# Patient Record
Sex: Female | Born: 1935 | Race: White | Hispanic: No | Marital: Married | Smoking: Never smoker
Health system: Southern US, Community
[De-identification: ages and names within clinical notes are randomized; demographics above are authoritative.]

## PROBLEM LIST (undated history)

## (undated) DIAGNOSIS — H409 Unspecified glaucoma: Secondary | ICD-10-CM

## (undated) DIAGNOSIS — K2270 Barrett's esophagus without dysplasia: Secondary | ICD-10-CM

## (undated) DIAGNOSIS — E785 Hyperlipidemia, unspecified: Secondary | ICD-10-CM

## (undated) DIAGNOSIS — K921 Melena: Secondary | ICD-10-CM

## (undated) DIAGNOSIS — R12 Heartburn: Secondary | ICD-10-CM

## (undated) DIAGNOSIS — C801 Malignant (primary) neoplasm, unspecified: Secondary | ICD-10-CM

## (undated) DIAGNOSIS — H269 Unspecified cataract: Secondary | ICD-10-CM

## (undated) DIAGNOSIS — F32A Depression, unspecified: Secondary | ICD-10-CM

## (undated) DIAGNOSIS — E871 Hypo-osmolality and hyponatremia: Secondary | ICD-10-CM

## (undated) DIAGNOSIS — D649 Anemia, unspecified: Secondary | ICD-10-CM

## (undated) DIAGNOSIS — F329 Major depressive disorder, single episode, unspecified: Secondary | ICD-10-CM

## (undated) DIAGNOSIS — R1319 Other dysphagia: Secondary | ICD-10-CM

## (undated) DIAGNOSIS — R911 Solitary pulmonary nodule: Secondary | ICD-10-CM

## (undated) DIAGNOSIS — K219 Gastro-esophageal reflux disease without esophagitis: Secondary | ICD-10-CM

## (undated) DIAGNOSIS — E119 Type 2 diabetes mellitus without complications: Secondary | ICD-10-CM

## (undated) DIAGNOSIS — K222 Esophageal obstruction: Secondary | ICD-10-CM

## (undated) DIAGNOSIS — I10 Essential (primary) hypertension: Secondary | ICD-10-CM

## (undated) HISTORY — DX: Gastro-esophageal reflux disease without esophagitis: K21.9

## (undated) HISTORY — PX: CHOLECYSTECTOMY: SHX55

## (undated) HISTORY — PX: NEPHRECTOMY: SHX65

## (undated) HISTORY — DX: Unspecified glaucoma: H40.9

## (undated) HISTORY — DX: Depression, unspecified: F32.A

## (undated) HISTORY — DX: Essential (primary) hypertension: I10

## (undated) HISTORY — DX: Unspecified cataract: H26.9

## (undated) HISTORY — DX: Hyperlipidemia, unspecified: E78.5

## (undated) HISTORY — DX: Esophageal obstruction: K22.2

## (undated) HISTORY — DX: Other dysphagia: R13.19

## (undated) HISTORY — DX: Type 2 diabetes mellitus without complications: E11.9

## (undated) HISTORY — DX: Heartburn: R12

## (undated) HISTORY — DX: Major depressive disorder, single episode, unspecified: F32.9

---

## 1982-04-19 HISTORY — PX: ABDOMINAL HYSTERECTOMY: SHX81

## 1997-10-15 ENCOUNTER — Other Ambulatory Visit: Admission: RE | Admit: 1997-10-15 | Discharge: 1997-10-15 | Payer: Self-pay | Admitting: Family Medicine

## 2005-08-11 ENCOUNTER — Emergency Department (HOSPITAL_COMMUNITY): Admission: EM | Admit: 2005-08-11 | Discharge: 2005-08-11 | Payer: Self-pay | Admitting: Emergency Medicine

## 2005-08-30 ENCOUNTER — Encounter (HOSPITAL_COMMUNITY): Admission: RE | Admit: 2005-08-30 | Discharge: 2005-09-29 | Payer: Self-pay | Admitting: Family Medicine

## 2008-04-08 ENCOUNTER — Ambulatory Visit: Payer: Self-pay | Admitting: Internal Medicine

## 2008-04-08 DIAGNOSIS — R12 Heartburn: Secondary | ICD-10-CM

## 2008-04-08 DIAGNOSIS — R1319 Other dysphagia: Secondary | ICD-10-CM

## 2008-04-08 DIAGNOSIS — R1310 Dysphagia, unspecified: Secondary | ICD-10-CM | POA: Insufficient documentation

## 2008-04-08 HISTORY — DX: Heartburn: R12

## 2008-04-08 HISTORY — DX: Other dysphagia: R13.19

## 2008-04-25 ENCOUNTER — Ambulatory Visit: Payer: Self-pay | Admitting: Internal Medicine

## 2008-04-25 ENCOUNTER — Encounter: Payer: Self-pay | Admitting: Internal Medicine

## 2008-04-29 ENCOUNTER — Encounter: Payer: Self-pay | Admitting: Internal Medicine

## 2008-07-03 ENCOUNTER — Encounter: Payer: Self-pay | Admitting: Family Medicine

## 2008-12-16 ENCOUNTER — Ambulatory Visit: Payer: Self-pay | Admitting: Family Medicine

## 2008-12-16 DIAGNOSIS — K222 Esophageal obstruction: Secondary | ICD-10-CM

## 2008-12-16 DIAGNOSIS — I10 Essential (primary) hypertension: Secondary | ICD-10-CM | POA: Insufficient documentation

## 2008-12-16 HISTORY — DX: Esophageal obstruction: K22.2

## 2008-12-16 HISTORY — DX: Essential (primary) hypertension: I10

## 2009-01-13 ENCOUNTER — Ambulatory Visit: Payer: Self-pay | Admitting: Family Medicine

## 2009-01-14 LAB — CONVERTED CEMR LAB
BUN: 10 mg/dL (ref 6–23)
CO2: 30 meq/L (ref 19–32)
Creatinine, Ser: 0.7 mg/dL (ref 0.4–1.2)
Potassium: 3.8 meq/L (ref 3.5–5.1)
Sodium: 137 meq/L (ref 135–145)

## 2009-01-16 ENCOUNTER — Ambulatory Visit: Payer: Self-pay | Admitting: Family Medicine

## 2009-01-16 DIAGNOSIS — E1165 Type 2 diabetes mellitus with hyperglycemia: Secondary | ICD-10-CM

## 2009-01-30 ENCOUNTER — Ambulatory Visit: Payer: Self-pay | Admitting: Family Medicine

## 2009-03-19 ENCOUNTER — Encounter: Payer: Self-pay | Admitting: Family Medicine

## 2009-03-24 ENCOUNTER — Encounter: Payer: Self-pay | Admitting: Internal Medicine

## 2009-05-02 ENCOUNTER — Ambulatory Visit: Payer: Self-pay | Admitting: Family Medicine

## 2009-05-06 LAB — CONVERTED CEMR LAB: Microalb, Ur: 0.5 mg/dL (ref 0.0–1.9)

## 2009-06-26 ENCOUNTER — Encounter: Payer: Self-pay | Admitting: Family Medicine

## 2009-07-01 ENCOUNTER — Encounter: Payer: Self-pay | Admitting: Family Medicine

## 2009-07-03 ENCOUNTER — Encounter: Payer: Self-pay | Admitting: Family Medicine

## 2009-07-08 ENCOUNTER — Telehealth: Payer: Self-pay | Admitting: Family Medicine

## 2009-09-01 ENCOUNTER — Ambulatory Visit: Payer: Self-pay | Admitting: Family Medicine

## 2009-09-01 DIAGNOSIS — E119 Type 2 diabetes mellitus without complications: Secondary | ICD-10-CM

## 2009-09-01 HISTORY — DX: Type 2 diabetes mellitus without complications: E11.9

## 2009-09-02 LAB — CONVERTED CEMR LAB
HDL: 55.8 mg/dL (ref >39.00)
Total CHOL/HDL Ratio: 4
VLDL: 31.2 mg/dL (ref 0.0–40.0)

## 2009-11-17 ENCOUNTER — Telehealth: Payer: Self-pay | Admitting: Family Medicine

## 2009-11-21 ENCOUNTER — Encounter: Payer: Self-pay | Admitting: Family Medicine

## 2009-12-01 ENCOUNTER — Ambulatory Visit: Payer: Self-pay | Admitting: Family Medicine

## 2009-12-01 DIAGNOSIS — E785 Hyperlipidemia, unspecified: Secondary | ICD-10-CM

## 2009-12-01 HISTORY — DX: Hyperlipidemia, unspecified: E78.5

## 2009-12-01 LAB — HM DIABETES EYE EXAM: HM Diabetic Eye Exam: NORMAL

## 2009-12-02 LAB — CONVERTED CEMR LAB
ALT: 44 units/L — ABNORMAL HIGH (ref 0–35)
AST: 35 units/L (ref 0–37)
Alkaline Phosphatase: 58 units/L (ref 39–117)
Bilirubin, Direct: 0.1 mg/dL (ref 0.0–0.3)
Cholesterol: 137 mg/dL (ref 0–200)
Hgb A1c MFr Bld: 7 % — ABNORMAL HIGH (ref 4.6–6.5)
LDL Cholesterol: 68 mg/dL (ref 0–99)
Total Bilirubin: 0.6 mg/dL (ref 0.3–1.2)
Total Protein: 6.9 g/dL (ref 6.0–8.3)

## 2010-03-03 LAB — HM DIABETES FOOT EXAM

## 2010-03-04 ENCOUNTER — Ambulatory Visit: Payer: Self-pay | Admitting: Family Medicine

## 2010-03-05 LAB — CONVERTED CEMR LAB
Creatinine,U: 77.7 mg/dL
Microalb Creat Ratio: 0.8 mg/g (ref 0.0–30.0)

## 2010-04-21 ENCOUNTER — Encounter: Payer: Self-pay | Admitting: Internal Medicine

## 2010-04-29 ENCOUNTER — Encounter: Payer: Self-pay | Admitting: Internal Medicine

## 2010-05-07 ENCOUNTER — Telehealth: Payer: Self-pay | Admitting: Internal Medicine

## 2010-05-19 ENCOUNTER — Telehealth (INDEPENDENT_AMBULATORY_CARE_PROVIDER_SITE_OTHER): Payer: Self-pay | Admitting: *Deleted

## 2010-05-19 ENCOUNTER — Encounter: Payer: Self-pay | Admitting: Internal Medicine

## 2010-05-19 NOTE — Progress Notes (Signed)
Summary: Pt called re: Metformin refill  Phone Note Call from Patient Call back at Home Phone [phone]   Caller: Patient Summary of Call: Pt called re: Metformin. Pts insurance will not cover pt taking 3 pills a day. Pt says that she was taking 2 and it was increase to 3 a day. Pls advise.    Initial call taken by: Lucy Antigua,  November 17, 2009 1:56 PM  Follow-up for Phone Call        Stu from [facility] called to say they just need a new RX for 3 tabs daily and insurance will pay.  New Rx sent Follow-up by: Sid Falcon LPN,  November 17, 2009 4:17 PM

## 2010-05-19 NOTE — Letter (Signed)
Summary: [facility]   Imported By: Maryln Gottron 04/21/2009 15:02:57  _____________________________________________________________________  External Attachment:    Type:   Image     Comment:   External Document

## 2010-05-19 NOTE — Medication Information (Signed)
Summary: Order for Diabetic Supplies  Order for Diabetic Supplies   Imported By: Maryln Gottron 06/30/2009 10:38:11  _____________________________________________________________________  External Attachment:    Type:   Image     Comment:   External Document

## 2010-05-19 NOTE — Assessment & Plan Note (Signed)
Summary: 3 mo ROV/cb   Vital Signs:  Patient profile:   77 year old female Weight:      174 pounds Temp:     98.3 degrees F oral BP sitting:   122 / 22  (left arm) Cuff size:   regular  Vitals Entered By: Sid Falcon LPN (March 04, 2010 10:02 AM)  History of Present Illness: Patient seen for followup medical problems. Type 2 diabetes well controlled. Fasting blood sugars 100-120. No postprandial blood sugars. No symptoms of hyper or hypoglycemia. Compliant with diet.  Hypertension treated with amlodipine and bisoprolol hydrochlorothiazide. previous urine microalbumin negative. Regular eye exams secondary to glaucoma.  Diabetes Management History:      She has not been enrolled in the "Diabetic Education Program".  She states understanding of dietary principles and is following her diet appropriately.  No sensory loss is reported.  Self foot exams are being performed.  She is checking home blood sugars.  She says that she is exercising.        Hypoglycemic symptoms are not occurring.  No hyperglycemic symptoms are reported.    Hypertension History:      She denies headache, chest pain, palpitations, dyspnea with exertion, orthopnea, PND, peripheral edema, visual symptoms, neurologic problems, syncope, and side effects from treatment.  She notes no problems with any antihypertensive medication side effects.        Positive major cardiovascular risk factors include female age 48 years old or older, diabetes, hyperlipidemia, and hypertension.  Negative major cardiovascular risk factors include non-tobacco-user status.     Allergies (verified): No Known Drug Allergies  Past History:  Past Surgical History: Last updated: 04/08/2008 Cholecystectomy Hysterectomy  Family History: Last updated: 12/16/2008 brain cancer sister Family History of Breast Cancer:sister No FH of Colon Cancer: Mother CVA  Social History: Last updated: 04/08/2008 Patient has never smoked.  Alcohol  Use - no Illicit Drug Use - no Patient gets regular exercise.  Risk Factors: Exercise: yes (04/08/2008)  Risk Factors: Smoking Status: never (04/08/2008)  Past Medical History: Hypertension GLAUCOMA TYPE 2 DM 9/10 Hyperlipidemia PMH-FH-SH reviewed for relevance  Review of Systems  The patient denies anorexia, fever, weight loss, chest pain, syncope, dyspnea on exertion, peripheral edema, prolonged cough, headaches, hemoptysis, abdominal pain, melena, and hematochezia.    Physical Exam  General:  Well-developed,well-nourished,in no acute distress; alert,appropriate and cooperative throughout examination Head:  Normocephalic and atraumatic without obvious abnormalities. No apparent alopecia or balding. Ears:  External ear exam shows no significant lesions or deformities.  Otoscopic examination reveals clear canals, tympanic membranes are intact bilaterally without bulging, retraction, inflammation or discharge. Hearing is grossly normal bilaterally. Mouth:  Oral mucosa and oropharynx without lesions or exudates.  Teeth in good repair. Neck:  No deformities, masses, or tenderness noted. Lungs:  Normal respiratory effort, chest expands symmetrically. Lungs are clear to auscultation, no crackles or wheezes. Heart:  normal rate and regular rhythm.   Extremities:  No clubbing, cyanosis, edema, or deformity noted with normal full range of motion of all joints.    Diabetes Management Exam:    Foot Exam (with socks and/or shoes not present):       Sensory-Pinprick/Light touch:          Left medial foot (L-4): normal          Left dorsal foot (L-5): normal          Left lateral foot (S-1): normal          Right medial foot (  L-4): normal          Right dorsal foot (L-5): normal          Right lateral foot (S-1): normal       Sensory-Monofilament:          Left foot: normal          Right foot: normal       Inspection:          Left foot: normal          Right foot: normal        Nails:          Left foot: too long          Right foot: too long    Eye Exam:       Eye Exam done elsewhere          Date: 11/19/2009          Results: normal          Done by: [facility]   Impression & Recommendations:  Problem # 1:  DIABETES MELLITUS, CONTROLLED (ICD-250.00)  Her updated medication list for this problem includes:    Metformin Hcl 500 Mg Tabs (Metformin hcl) .Marland Kitchen..Marland Kitchen Two tabs in am, one tab in pm    Glimepiride 2 Mg Tabs (Glimepiride) ..... One by mouth once daily  Orders: Venipuncture (16109) Specimen Handling (60454) TLB-A1C / Hgb A1C (Glycohemoglobin) (83036-A1C) TLB-Microalbumin/Creat Ratio, Urine (82043-MALB)  Problem # 2:  ESSENTIAL HYPERTENSION (ICD-401.9)  Her updated medication list for this problem includes:    Bisoprolol-hydrochlorothiazide 5-6.25 Mg Tabs (Bisoprolol-hydrochlorothiazide) ..... One by mouth once daily    Amlodipine Besylate 5 Mg Tabs (Amlodipine besylate) ..... One by mouth once daily  Problem # 3:  HYPERLIPIDEMIA (ICD-272.4)  Her updated medication list for this problem includes:    Simvastatin 20 Mg Tabs (Simvastatin) ..... Once daily  Complete Medication List: 1)  Alphagan P 0.15 % Soln (Brimonidine tartrate) .... 2 drops two times a day 2)  Dorzolamide Hcl 2 % Soln (Dorzolamide hcl) .... 2 drops two times a day 3)  Nexium 40 Mg Cpdr (Esomeprazole magnesium) .... Take 1 capsule by mouth 30 minutes before breakfast. 4)  Xalatan 0.005 % Soln (Latanoprost) 5)  Bisoprolol-hydrochlorothiazide 5-6.25 Mg Tabs (Bisoprolol-hydrochlorothiazide) .... One by mouth once daily 6)  Amlodipine Besylate 5 Mg Tabs (Amlodipine besylate) .... One by mouth once daily 7)  Metformin Hcl 500 Mg Tabs (Metformin hcl) .... Two tabs in am, one tab in pm 8)  Glimepiride 2 Mg Tabs (Glimepiride) .... One by mouth once daily 9)  Freestyle Lancets Misc (Lancets) .... Daily bs check 10)  Freestyle Lite Test Strp (Glucose blood) .... One to two times daily as  needed 11)  Simvastatin 20 Mg Tabs (Simvastatin) .... Once daily  Diabetes Management Assessment/Plan:      Her blood pressure goal is < 130/80.    Hypertension Assessment/Plan:      The patient's hypertensive risk group is category C: Target organ damage and/or diabetes.  Her calculated 10 year risk of coronary heart disease is 15 %.  Today's blood pressure is 122/22.  Her blood pressure goal is < 130/80.  Patient Instructions: 1)  Please schedule a follow-up appointment in 6 months .  2)  Check your blood sugars regularly. If your readings are usually above:  or below 70 you should contact our office.  3)  It is important that your diabetic A1c level is checked every 3 months.  4)  See your eye doctor yearly to check for diabetic eye damage. 5)  Check your feet each night  for sore areas, calluses or signs of infection.    Orders Added: 1)  Venipuncture [09811] 2)  Specimen Handling [99000] 3)  TLB-A1C / Hgb A1C (Glycohemoglobin) [83036-A1C] 4)  TLB-Microalbumin/Creat Ratio, Urine [82043-MALB] 5)  Est. Patient Level IV [91478]

## 2010-05-19 NOTE — Assessment & Plan Note (Signed)
Summary: ROV//SLM pt rsc/njr   Vital Signs:  Patient profile:   77 year old female Weight:      176 pounds Temp:     97.6 degrees F oral BP sitting:   130 / 80  (left arm) Cuff size:   regular  Vitals Entered By: Sid Falcon LPN (Sep 01, 2009 10:50 AM) CC: 4 month follow-up Is Patient Diabetic? Yes Did you bring your meter with you today? No   History of Present Illness: Followup type 2 diabetes. Fasting blood sugars ranging around 130-135. No hypoglycemic symptoms. Poor dietary compliance at times. Weight has remained unchanged. Due for repeat eye exam a couple of months. This has been scheduled.  Allergies (verified): No Known Drug Allergies  Past History:  Past Medical History: Last updated: 01/16/2009 Hypertension GLAUCOMA TYPE 2 DM 9/10  Past Surgical History: Last updated: 04/08/2008 Cholecystectomy Hysterectomy  Social History: Last updated: 04/08/2008 Patient has never smoked.  Alcohol Use - no Illicit Drug Use - no Patient gets regular exercise.  Review of Systems  The patient denies chest pain, syncope, dyspnea on exertion, peripheral edema, headaches, and abdominal pain.    Physical Exam  General:  Well-developed,well-nourished,in no acute distress; alert,appropriate and cooperative throughout examination Mouth:  Oral mucosa and oropharynx without lesions or exudates.  Teeth in good repair. Neck:  No deformities, masses, or tenderness noted. Lungs:  Normal respiratory effort, chest expands symmetrically. Lungs are clear to auscultation, no crackles or wheezes. Heart:  Normal rate and regular rhythm. S1 and S2 normal without gallop, murmur, click, rub or other extra sounds. Extremities:  No clubbing, cyanosis, edema, or deformity noted with normal full range of motion of all joints.     Impression & Recommendations:  Problem # 1:  DIABETES MELLITUS, CONTROLLED (ICD-250.00)  recheck A1c and patient also needs reassessment of lipids Her  updated medication list for this problem includes:    Metformin Hcl 500 Mg Tabs (Metformin hcl) ..... One by mouth two times a day    Glimepiride 2 Mg Tabs (Glimepiride) ..... One by mouth once daily  Orders: Venipuncture (16109) TLB-Lipid Panel (80061-LIPID) TLB-A1C / Hgb A1C (Glycohemoglobin) (83036-A1C)  Complete Medication List: 1)  Alphagan P 0.15 % Soln (Brimonidine tartrate) .... 2 drops two times a day 2)  Dorzolamide Hcl 2 % Soln (Dorzolamide hcl) .... 2 drops two times a day 3)  Nexium 40 Mg Cpdr (Esomeprazole magnesium) .... Take 1 capsule by mouth 30 minutes before breakfast. 4)  Xalatan 0.005 % Soln (Latanoprost) 5)  Bisoprolol-hydrochlorothiazide 5-6.25 Mg Tabs (Bisoprolol-hydrochlorothiazide) .... One by mouth once daily 6)  Amlodipine Besylate 5 Mg Tabs (Amlodipine besylate) .... One by mouth once daily 7)  Metformin Hcl 500 Mg Tabs (Metformin hcl) .... One by mouth two times a day 8)  Glimepiride 2 Mg Tabs (Glimepiride) .... One by mouth once daily 9)  Freestyle Lancets Misc (Lancets) .... Daily bs check 10)  Freestyle Lite Test Strp (Glucose blood) .... One to two times daily as needed  Patient Instructions: 1)  Please schedule a follow-up appointment in 3 months .  2)  Check your blood sugars regularly. If your readings are usually above: 140 or below 70 you should contact our office.  3)  It is important that your diabetic A1c level is checked every 3 months.  4)  See your eye doctor yearly to check for diabetic eye damage. 5)  Check your feet each night  for sore areas, calluses or signs of infection.

## 2010-05-19 NOTE — Progress Notes (Signed)
Summary: Diabetic, pt requesting Rx for sopport hose  Phone Note Call from Patient   Caller: Kathleen Schroeder [phone] Call For: Kathleen Peat MD Summary of Call: Son left VM requesting call back to discuss order for support hose for his mother as she is experiencing leg pain. LMTCB Initial call taken by: Sid Falcon LPN,  July 08, 2009 10:37 AM  Follow-up for Phone Call        Son left a message to call his mom personally.  Pt had OV scheduled for 3/28, cancelled and rescheduled for May 16.  Pt states at last OV he had recommended support hose, she just was not interrested until recently Pt reports some bil leg discomfort,lower leg cramping in the AM,  and wondered if support hose would be a good idea for her.  If yes, please send order to [facility] as they would like Medicare to help pay for stockings Follow-up by: Sid Falcon LPN,  July 09, 2009 12:05 PM  Additional Follow-up for Phone Call Additional follow up Details #1::        written. Additional Follow-up by: Kathleen Peat MD,  July 09, 2009 1:10 PM    Additional Follow-up for Phone Call Additional follow up Details #2::    Rx faxed, confirmation received, pt informed Follow-up by: Sid Falcon LPN,  July 09, 2009 1:54 PM

## 2010-05-19 NOTE — Assessment & Plan Note (Signed)
Summary: 3 MNTH ROV//SLM   Vital Signs:  Patient profile:   77 year old female Weight:      175 pounds Temp:     98.5 degrees F oral BP sitting:   120 / 70  (left arm) Cuff size:   regular  Vitals Entered By: Sid Falcon LPN (May 02, 2009 10:43 AM) CC: 3 month follow-up, Hypertension Management Is Patient Diabetic? Yes Did you bring your meter with you today? No   History of Present Illness: Patient here for followup diabetes and hypertension.  diabetes just diagnosed last fall.  she has done extremely well and fasting blood sugars have been mostly low 100s. No hypoglycemia.  Hyperglycemic sxs have resolved.  Eye checkups every 3 months. Has some visual difficulties which she relates to glaucoma.  Blood pressures been well controlled. Compliant with all medications. Denies side effects from medication. Occasional mild edema lower legs is very minimal.  Diabetes Management History:      She has not been enrolled in the "Diabetic Education Program".  She states understanding of dietary principles and is following her diet appropriately.  No sensory loss is reported.  Self foot exams are being performed.  She is checking home blood sugars.  She says that she is exercising.        Hypoglycemic symptoms are not occurring.  No hyperglycemic symptoms are reported.        There are no symptoms to suggest diabetic complications.  No changes have been made to her treatment plan since last visit.    Hypertension History:      She denies headache, chest pain, palpitations, dyspnea with exertion, orthopnea, PND, neurologic problems, syncope, and side effects from treatment.  She notes no problems with any antihypertensive medication side effects.        Positive major cardiovascular risk factors include female age 69 years old or older, diabetes, and hypertension.  Negative major cardiovascular risk factors include non-tobacco-user status.     Allergies (verified): No Known Drug  Allergies  Past History:  Past Medical History: Last updated: 01/16/2009 Hypertension GLAUCOMA TYPE 2 DM 9/10  Past Surgical History: Last updated: 04/08/2008 Cholecystectomy Hysterectomy  Social History: Last updated: 04/08/2008 Patient has never smoked.  Alcohol Use - no Illicit Drug Use - no Patient gets regular exercise.  Review of Systems      See HPI  Physical Exam  General:  Well-developed,well-nourished,in no acute distress; alert,appropriate and cooperative throughout examination Ears:  External ear exam shows no significant lesions or deformities.  Otoscopic examination reveals clear canals, tympanic membranes are intact bilaterally without bulging, retraction, inflammation or discharge. Hearing is grossly normal bilaterally. Mouth:  Oral mucosa and oropharynx without lesions or exudates.  Teeth in good repair. Neck:  No deformities, masses, or tenderness noted. Lungs:  Normal respiratory effort, chest expands symmetrically. Lungs are clear to auscultation, no crackles or wheezes. Heart:  normal rate, regular rhythm, and no gallop.   Extremities:  No clubbing, cyanosis, edema, or deformity noted with normal full range of motion of all joints.  no foot lesions noted  Diabetes Management Exam:    Foot Exam (with socks and/or shoes not present):       Sensory-Pinprick/Light touch:          Left medial foot (L-4): normal          Left dorsal foot (L-5): normal          Left lateral foot (S-1): normal  Right medial foot (L-4): normal          Right dorsal foot (L-5): normal          Right lateral foot (S-1): normal       Sensory-Monofilament:          Left foot: normal          Right foot: normal       Inspection:          Left foot: normal          Right foot: normal       Nails:          Left foot: normal          Right foot: normal   Impression & Recommendations:  Problem # 1:  DIABETES MELLITUS, TYPE II, UNCONTROLLED (ICD-250.02) Assessment  Improved recheck A1C.  urine microalb. Her updated medication list for this problem includes:    Metformin Hcl 500 Mg Tabs (Metformin hcl) ..... One by mouth two times a day    Glimepiride 2 Mg Tabs (Glimepiride) ..... One by mouth once daily  Orders: TLB-A1C / Hgb A1C (Glycohemoglobin) (83036-A1C) TLB-Microalbumin/Creat Ratio, Urine (82043-MALB) Venipuncture (27253)  Problem # 2:  ESSENTIAL HYPERTENSION (ICD-401.9) Assessment: Unchanged  Her updated medication list for this problem includes:    Bisoprolol-hydrochlorothiazide 5-6.25 Mg Tabs (Bisoprolol-hydrochlorothiazide) ..... One by mouth once daily    Amlodipine Besylate 5 Mg Tabs (Amlodipine besylate) ..... One by mouth once daily  Complete Medication List: 1)  Alphagan P 0.15 % Soln (Brimonidine tartrate) .... 2 drops two times a day 2)  Dorzolamide Hcl 2 % Soln (Dorzolamide hcl) .... 2 drops two times a day 3)  Nexium 40 Mg Cpdr (Esomeprazole magnesium) .... Take 1 capsule by mouth 30 minutes before breakfast. 4)  Xalatan 0.005 % Soln (Latanoprost) 5)  Bisoprolol-hydrochlorothiazide 5-6.25 Mg Tabs (Bisoprolol-hydrochlorothiazide) .... One by mouth once daily 6)  Amlodipine Besylate 5 Mg Tabs (Amlodipine besylate) .... One by mouth once daily 7)  Metformin Hcl 500 Mg Tabs (Metformin hcl) .... One by mouth two times a day 8)  Glimepiride 2 Mg Tabs (Glimepiride) .... One by mouth once daily 9)  Fluconazole 150 Mg Tabs (Fluconazole) .... One by mouth times one dose 10)  Freestyle Lancets Misc (Lancets) .... Daily bs check 11)  Freestyle Lite Test Strp (Glucose blood) .... One to two times daily as needed  Hypertension Assessment/Plan:      The patient's hypertensive risk group is category C: Target organ damage and/or diabetes.  Today's blood pressure is 120/70.    Patient Instructions: 1)  Please schedule a follow-up appointment in 4 months .  2)  It is important that you exercise reguarly at least 20 minutes 5 times a week.  If you develop chest pain, have severe difficulty breathing, or feel very tired, stop exercising immediately and seek medical attention.  3)  Check your blood sugars regularly. If your readings are usually above:  or below 70 you should contact our office.  4)  See your eye doctor yearly to check for diabetic eye damage. 5)  Check your feet each night  for sore areas, calluses or signs of infection.

## 2010-05-19 NOTE — Assessment & Plan Note (Signed)
Summary: 3 month rov/njr   Vital Signs:  Patient profile:   77 year old female Weight:      174 pounds Temp:     97.8 degrees F oral BP sitting:   140 / 80  (left arm) Cuff size:   regular  Vitals Entered By: Sid Falcon LPN (December 01, 2009 10:24 AM) CC: 3 month follow-up, Hypertension Management Is Patient Diabetic? Yes Did you bring your meter with you today? Yes  Vision Screening:      Vision Comments: 07/2010   History of Present Illness: Follow for multiple medical problems as below  Hypertension treated with bisoprolol- hydrochlorothiazide and amlodipine. Patient compliant with medications. No orthostatic symptoms.  Type 2 diabetes. Recent A1c 7.5%. Titrated metformin. Blood sugars reviewed and she is seeing improvement past couple of months. No hypoglycemic symptoms.  Hyperlipidemia. LDL not at goal for diabetic. Initiated simvastatin 20 mg daily. No myalgias or other side effect. Needs followup labs.  Diabetes Management History:      She has not been enrolled in the "Diabetic Education Program".  She states understanding of dietary principles and is following her diet appropriately.  No sensory loss is reported.  Self foot exams are being performed.  She is checking home blood sugars.  She says that she is exercising.        Hypoglycemic symptoms are not occurring.  No hyperglycemic symptoms are reported.        The following changes have been made to her treatment plan since last visit: medication changes.  Treatment plan changes were initiated by MD.    Hypertension History:      She denies headache, chest pain, palpitations, dyspnea with exertion, orthopnea, PND, peripheral edema, neurologic problems, syncope, and side effects from treatment.        Positive major cardiovascular risk factors include female age 63 years old or older, diabetes, hyperlipidemia, and hypertension.  Negative major cardiovascular risk factors include non-tobacco-user status.      Allergies (verified): No Known Drug Allergies  Past History:  Past Medical History: Last updated: 01/16/2009 Hypertension GLAUCOMA TYPE 2 DM 9/10  Past Surgical History: Last updated: 04/08/2008 Cholecystectomy Hysterectomy  Family History: Last updated: 12/16/2008 brain cancer sister Family History of Breast Cancer:sister No FH of Colon Cancer: Mother CVA  Social History: Last updated: 04/08/2008 Patient has never smoked.  Alcohol Use - no Illicit Drug Use - no Patient gets regular exercise.  Risk Factors: Exercise: yes (04/08/2008)  Risk Factors: Smoking Status: never (04/08/2008)  Review of Systems  The patient denies anorexia, fever, weight loss, weight gain, chest pain, syncope, dyspnea on exertion, peripheral edema, prolonged cough, headaches, hemoptysis, abdominal pain, melena, hematochezia, and severe indigestion/heartburn.    Physical Exam  General:  Well-developed,well-nourished,in no acute distress; alert,appropriate and cooperative throughout examination Mouth:  Oral mucosa and oropharynx without lesions or exudates.  Teeth in good repair. Neck:  No deformities, masses, or tenderness noted. Lungs:  Normal respiratory effort, chest expands symmetrically. Lungs are clear to auscultation, no crackles or wheezes. Heart:  normal rate and regular rhythm.   Extremities:  no edema  Diabetes Management Exam:    Foot Exam (with socks and/or shoes not present):       Sensory-Pinprick/Light touch:          Left medial foot (L-4): normal          Left dorsal foot (L-5): normal          Left lateral foot (S-1): normal  Right medial foot (L-4): normal          Right dorsal foot (L-5): normal          Right lateral foot (S-1): normal       Sensory-Monofilament:          Left foot: normal          Right foot: normal       Inspection:          Left foot: normal          Right foot: normal       Nails:          Left foot: normal          Right  foot: normal    Eye Exam:       Eye Exam done elsewhere          Date: 07/23/2009          Results: normal          Done by: Dr Gwen Pounds   Impression & Recommendations:  Problem # 1:  DIABETES MELLITUS, TYPE II, UNCONTROLLED (ICD-250.02) repeat A1C Her updated medication list for this problem includes:    Metformin Hcl 500 Mg Tabs (Metformin hcl) .Marland Kitchen..Marland Kitchen Two tabs in am, one tab in pm    Glimepiride 2 Mg Tabs (Glimepiride) ..... One by mouth once daily  Orders: TLB-A1C / Hgb A1C (Glycohemoglobin) (83036-A1C)  Problem # 2:  ESSENTIAL HYPERTENSION (ICD-401.9)  Her updated medication list for this problem includes:    Bisoprolol-hydrochlorothiazide 5-6.25 Mg Tabs (Bisoprolol-hydrochlorothiazide) ..... One by mouth once daily    Amlodipine Besylate 5 Mg Tabs (Amlodipine besylate) ..... One by mouth once daily  Problem # 3:  HYPERLIPIDEMIA (ICD-272.4) repeat lipids. Her updated medication list for this problem includes:    Simvastatin 20 Mg Tabs (Simvastatin) ..... Once daily  Orders: Venipuncture (38756) Specimen Handling (43329) TLB-Lipid Panel (80061-LIPID) TLB-Hepatic/Liver Function Pnl (80076-HEPATIC)  Complete Medication List: 1)  Alphagan P 0.15 % Soln (Brimonidine tartrate) .... 2 drops two times a day 2)  Dorzolamide Hcl 2 % Soln (Dorzolamide hcl) .... 2 drops two times a day 3)  Nexium 40 Mg Cpdr (Esomeprazole magnesium) .... Take 1 capsule by mouth 30 minutes before breakfast. 4)  Xalatan 0.005 % Soln (Latanoprost) 5)  Bisoprolol-hydrochlorothiazide 5-6.25 Mg Tabs (Bisoprolol-hydrochlorothiazide) .... One by mouth once daily 6)  Amlodipine Besylate 5 Mg Tabs (Amlodipine besylate) .... One by mouth once daily 7)  Metformin Hcl 500 Mg Tabs (Metformin hcl) .... Two tabs in am, one tab in pm 8)  Glimepiride 2 Mg Tabs (Glimepiride) .... One by mouth once daily 9)  Freestyle Lancets Misc (Lancets) .... Daily bs check 10)  Freestyle Lite Test Strp (Glucose blood) .... One to  two times daily as needed 11)  Simvastatin 20 Mg Tabs (Simvastatin) .... Once daily  Diabetes Management Assessment/Plan:      Her blood pressure goal is < 130/80.    Hypertension Assessment/Plan:      The patient's hypertensive risk group is category C: Target organ damage and/or diabetes.  Today's blood pressure is 140/80.  Her blood pressure goal is < 130/80.  Patient Instructions: 1)  Please schedule a follow-up appointment in 3 months .  2)  It is important that you exercise reguarly at least 20 minutes 5 times a week. If you develop chest pain, have severe difficulty breathing, or feel very tired, stop exercising immediately and seek medical attention.  3)  Check your  blood sugars regularly. If your readings are usually above:  or below 70 you should contact our office.  4)  See your eye doctor yearly to check for diabetic eye damage. 5)  Check your feet each night  for sore areas, calluses or signs of infection.  Prescriptions: SIMVASTATIN 20 MG TABS (SIMVASTATIN) once daily  #30 x 11   Entered and Authorized by:   Evelena Peat MD   Signed by:   Evelena Peat MD on 12/01/2009   Method used:   Faxed to ...       Hospital doctor (retail)       [address]       [place], [place]  [address]       Ph: [phone] or [phone]       Fax: [phone]   RxID:   [id number] GLIMEPIRIDE 2 MG TABS (GLIMEPIRIDE) one by mouth once daily  #30 x 11   Entered and Authorized by:   Evelena Peat MD   Signed by:   Evelena Peat MD on 12/01/2009   Method used:   Faxed to ...       Hospital doctor (retail)       [address]       [place], [place]  [address]       Ph: [phone] or [phone]       Fax: [phone]   RxID:   [id number] AMLODIPINE BESYLATE 5 MG TABS (AMLODIPINE BESYLATE) one by mouth once daily  #30 x 11   Entered and Authorized by:   Evelena Peat MD   Signed by:   Evelena Peat  MD on 12/01/2009   Method used:   Faxed to ...       Hospital doctor (retail)       [address]       [place], [place]  [address]       Ph: [phone] or [phone]       Fax: [phone]   RxID:   [id number] BISOPROLOL-HYDROCHLOROTHIAZIDE 5-6.25 MG TABS (BISOPROLOL-HYDROCHLOROTHIAZIDE) one by mouth once daily  #30 x 11   Entered and Authorized by:   Evelena Peat MD   Signed by:   Evelena Peat MD on 12/01/2009   Method used:   Faxed to ...       Hospital doctor (retail)       [address]       [place], [place]  [address]       Ph: [phone] or [phone]       Fax: [phone]   RxID:   [id number]

## 2010-05-19 NOTE — Medication Information (Signed)
Summary: Order for Diabetic Supplies  Order for Diabetic Supplies   Imported By: Maryln Gottron 07/02/2009 15:48:07  _____________________________________________________________________  External Attachment:    Type:   Image     Comment:   External Document

## 2010-05-19 NOTE — Medication Information (Signed)
Summary: Order for Diabetes Testing Supplies  Order for Diabetes Testing Supplies   Imported By: Maryln Gottron 11/26/2009 12:21:35  _____________________________________________________________________  External Attachment:    Type:   Image     Comment:   External Document

## 2010-05-19 NOTE — Medication Information (Signed)
Summary: Order for Diabetic Supplies  Order for Diabetic Supplies   Imported By: Maryln Gottron 07/04/2009 13:40:05  _____________________________________________________________________  External Attachment:    Type:   Image     Comment:   External Document

## 2010-05-21 NOTE — Letter (Signed)
Summary: Endoscopy Letter  [facility]  [address] [place], [place] [address]   Phone: [phone]  Fax: [phone]      April 29, 2010 MRN: [id number]   KENDEL PESNELL [address] [place], [place]  [address]   Dear Ms. Luan Pulling,   According to your medical record, it is time for you to schedule an Endoscopy. Endoscopic screening is recommended for patients with certain upper digestive tract conditions because of associated increased risk for cancers of the upper digestive system.  This letter has been generated based on the recommendations made at the time of your prior procedure. If you feel that in your particular situation this may no longer apply, please contact our office.  Please call our office at [phone]) to schedule this appointment or to update your records at your earliest convenience.  Thank you for cooperating with [place] to provide you with the very best care possible.   Sincerely,  Hedwig Morton. Juanda Chance, M.D.  [facility] [phone]

## 2010-05-21 NOTE — Progress Notes (Signed)
Summary: fyi  Phone Note Call from Patient   Caller: Patient Call For: Dr Juanda Chance Summary of Call: Patient wants to let Dr Juanda Chance know that she does not need to have another EGD because she has not had any trouble since her last one. Initial call taken by: Tawni Levy,  May 07, 2010 1:03 PM  Follow-up for Phone Call        Called patient and explained that when she had her EGD in 04/2008, she had Barrett's esophagus. Explained to patient that this while it is not cancer, can be precancerous and needs to be watched. Told her Dr. Juanda Chance recommends she have an endoscopy. Patient states she will discuss a time to do this with her husband and call back to schedule. Follow-up by: Jesse Fall RN,  May 07, 2010 1:38 PM  Additional Follow-up for Phone Call Additional follow up Details #1::        The reason for repear EGD is not her symptoms but the precancerous tissue in her esophagus. 2 year interval is resommended by [facility] to biopsy the suspicious tissue in her esophagus. If she fails to schedule, please resend recall  letter in 6 months Additional Follow-up by: Hart Carwin MD,  May 07, 2010 5:04 PM    Additional Follow-up for Phone Call Additional follow up Details #2::    Sent a flag to [facility] to send a recall is patient has not rescheduled  by 09/2010.  Follow-up by: Jesse Fall RN,  May 08, 2010 9:09 AM

## 2010-05-21 NOTE — Procedures (Signed)
Summary: Recall Assessment  Recall Assessment   Imported By: Lester [place] 05/06/2010 10:46:01  _____________________________________________________________________  External Attachment:    Type:   Image     Comment:   External Document

## 2010-05-27 NOTE — Letter (Signed)
Summary: Pre Visit Letter Revised  [facility]  [address] [place], [place] [address]   Phone: [phone]  Fax: [phone]        05/19/2010 MRN: [id number] Kathleen Schroeder [address] [place], [place]  [address]             Procedure Date:  06/23/2010  Welcome to the Gastroenterology Division at [facility].    You are scheduled to see a nurse for your pre-procedure visit on 06/16/2010  at 1:30 PM  on the 3rd floor at [facility], [address]  We ask that you try to arrive at our office 15 minutes prior to your appointment time to allow for check-in.  Please take a minute to review the attached form.  If you answer "Yes" to one or more of the questions on the first page, we ask that you call the person listed at your earliest opportunity.  If you answer "No" to all of the questions, please complete the rest of the form and bring it to your appointment.    Your nurse visit will consist of discussing your medical and surgical history, your immediate family medical history, and your medications.   If you are unable to list all of your medications on the form, please bring the medication bottles to your appointment and we will list them.  We will need to be aware of both prescribed and over the counter drugs.  We will need to know exact dosage information as well.    Please be prepared to read and sign documents such as consent forms, a financial agreement, and acknowledgement forms.  If necessary, and with your consent, a friend or relative is welcome to sit-in on the nurse visit with you.  Please bring your insurance card so that we may make a copy of it.  If your insurance requires a referral to see a specialist, please bring your referral form from your primary care physician.  No co-pay is required for this nurse visit.     If you cannot keep your appointment, please call [phone] to cancel or reschedule prior to your appointment date.  This allows  [place] the opportunity to schedule an appointment for another patient in need of care.    Thank you for choosing [facility] for your medical needs.  We appreciate the opportunity to care for you.  Please visit [place] at our website  to learn more about our practice.  Sincerely, The Gastroenterology Division          Appended Document: Pre Visit Letter Revised Mailed out to patient on 05/19/10

## 2010-05-27 NOTE — Progress Notes (Signed)
  Phone Note Outgoing Call Call back at [facility] Phone [phone]   Call placed by: Jesse Fall, Rn Call placed to: Patient Summary of Call: Spoke with patient re: scheduling an EGD. She wishes to schedule in March to try to avoid bad weather. Scheduled patient on 06/23/10 1:30 PM for EGD with Dr. Juanda Chance at [facility]. Scheduled patient for previsit on 06/16/10 at 1:30 PM. Letter mailed to patient for previsit. Initial call taken by: Jesse Fall RN,  May 19, 2010 9:54 AM

## 2010-06-23 ENCOUNTER — Other Ambulatory Visit: Payer: Self-pay | Admitting: Internal Medicine

## 2010-07-15 ENCOUNTER — Other Ambulatory Visit: Payer: Self-pay | Admitting: Family Medicine

## 2010-08-03 ENCOUNTER — Other Ambulatory Visit: Payer: Self-pay | Admitting: Family Medicine

## 2010-09-01 ENCOUNTER — Encounter: Payer: Self-pay | Admitting: Family Medicine

## 2010-09-02 ENCOUNTER — Encounter: Payer: Self-pay | Admitting: Family Medicine

## 2010-09-02 ENCOUNTER — Ambulatory Visit (INDEPENDENT_AMBULATORY_CARE_PROVIDER_SITE_OTHER): Payer: Medicare Other | Admitting: Family Medicine

## 2010-09-02 DIAGNOSIS — E785 Hyperlipidemia, unspecified: Secondary | ICD-10-CM

## 2010-09-02 DIAGNOSIS — I10 Essential (primary) hypertension: Secondary | ICD-10-CM

## 2010-09-02 DIAGNOSIS — E119 Type 2 diabetes mellitus without complications: Secondary | ICD-10-CM

## 2010-09-02 DIAGNOSIS — L820 Inflamed seborrheic keratosis: Secondary | ICD-10-CM

## 2010-09-02 LAB — BASIC METABOLIC PANEL
Calcium: 10 mg/dL (ref 8.4–10.5)
Creatinine, Ser: 0.8 mg/dL (ref 0.4–1.2)
GFR: 77.06 mL/min (ref >60.00)

## 2010-09-02 LAB — HEPATIC FUNCTION PANEL
ALT: 73 U/L — ABNORMAL HIGH (ref 0–35)
Albumin: 3.6 g/dL (ref 3.5–5.2)
Bilirubin, Direct: 0.1 mg/dL (ref 0.0–0.3)
Total Protein: 6.8 g/dL (ref 6.0–8.3)

## 2010-09-02 LAB — HEMOGLOBIN A1C: Hgb A1c MFr Bld: 7.3 % — ABNORMAL HIGH (ref 4.6–6.5)

## 2010-09-02 LAB — LIPID PANEL
Cholesterol: 140 mg/dL (ref 0–200)
HDL: 52.4 mg/dL (ref >39.00)
LDL Cholesterol: 65 mg/dL (ref 0–99)
Total CHOL/HDL Ratio: 3
Triglycerides: 114 mg/dL (ref 0.0–149.0)
VLDL: 22.8 mg/dL (ref 0.0–40.0)

## 2010-09-02 MED ORDER — GLUCOSE BLOOD VI STRP: ORAL_STRIP | Status: DC

## 2010-09-02 NOTE — Progress Notes (Signed)
  Subjective:    Patient ID: Kathleen Schroeder, female    DOB: 1930/06/18, 77 y.o.   MRN: [id number]  HPI Here for followup multiple medical problems. She has type 2 diabetes, hyperlipidemia, hypertension, history of GERD with esophageal stricture. Medications reviewed. Compliant with all. Fasting blood sugars around 120. No symptoms of hyper or hypoglycemia. Walks some for exercise. No recent chest pain or dizziness.  No recent dysphagia.  GERD stable.  She has tried to leave off Nexium but consistent break through symptoms.  No side effects from simvastatin such as myalgias. No history of CAD or peripheral vascular disease. She gets regular eye exams 2-3 times per year from ophthalmologist.  New problem of irritated seborrheic keratosis right cheek. She will like to have treated. No history of skin cancer   Review of Systems  Constitutional: Negative for fatigue.  Eyes: Negative for visual disturbance.  Respiratory: Negative for cough, chest tightness, shortness of breath and wheezing.   Cardiovascular: Negative for chest pain, palpitations and leg swelling.  Genitourinary: Negative for dysuria.  Neurological: Negative for dizziness, seizures, syncope, weakness, light-headedness and headaches.  Psychiatric/Behavioral: Negative for dysphoric mood.       Objective:   Physical Exam  Constitutional: She is oriented to person, place, and time. She appears well-developed and well-nourished.  HENT:  Head: Normocephalic and atraumatic.  Right Ear: External ear normal.  Left Ear: External ear normal.  Mouth/Throat: Oropharynx is clear and moist. No oropharyngeal exudate.  Neck: No thyromegaly present.  Cardiovascular: Normal rate and regular rhythm.   Pulmonary/Chest: Effort normal and breath sounds normal. No respiratory distress. She has no wheezes. She has no rales.  Musculoskeletal: She exhibits no edema.  Lymphadenopathy:    She has no cervical adenopathy.  Neurological: She is alert  and oriented to person, place, and time. No cranial nerve deficit.  Skin:       Right cheek reveals brown well-demarcated scaly lesion compatible with seborrheic keratosis  Psychiatric: She has a normal mood and affect.          Assessment & Plan:  #1 type 2 diabetes. Reassess A1c. Continue yearly exam #2 hyperlipidemia. Recheck lipid and hepatic panel #3 hypertension stable continue current medications #4 irritated seborrheic keratosis right cheek discussed risk and benefits of treatment with liquid nitrogen and she consents.  Treated without difficulty

## 2010-09-03 NOTE — Progress Notes (Signed)
Quick Note:  Pt informed, labs mailed to pt home with instructions ______

## 2010-09-18 ENCOUNTER — Encounter: Payer: Self-pay | Admitting: *Deleted

## 2010-09-18 ENCOUNTER — Telehealth: Payer: Self-pay | Admitting: *Deleted

## 2010-09-18 NOTE — Telephone Encounter (Signed)
Letter mailed to patient for EGD recall

## 2010-09-18 NOTE — Telephone Encounter (Signed)
Message copied by Kathleen Schroeder on Fri Sep 18, 2010  8:59 AM ------      Message from: Kathleen Schroeder      Created: Wed Jun 24, 2010 11:32 AM       Patient should have scheduled a recall EGD for Barrett's. If she has not send recall letter 09/2010

## 2010-09-23 ENCOUNTER — Emergency Department (HOSPITAL_COMMUNITY)
Admission: EM | Admit: 2010-09-23 | Discharge: 2010-09-23 | Disposition: A | Discharge status: Home / Self Care | Payer: Medicare Other | Attending: Emergency Medicine | Admitting: Emergency Medicine

## 2010-09-23 ENCOUNTER — Emergency Department (HOSPITAL_COMMUNITY): Payer: Medicare Other

## 2010-09-23 DIAGNOSIS — Z79899 Other long term (current) drug therapy: Secondary | ICD-10-CM | POA: Insufficient documentation

## 2010-09-23 DIAGNOSIS — R0789 Other chest pain: Secondary | ICD-10-CM | POA: Insufficient documentation

## 2010-09-23 DIAGNOSIS — E119 Type 2 diabetes mellitus without complications: Secondary | ICD-10-CM | POA: Insufficient documentation

## 2010-09-23 DIAGNOSIS — M549 Dorsalgia, unspecified: Secondary | ICD-10-CM | POA: Insufficient documentation

## 2010-09-23 DIAGNOSIS — I10 Essential (primary) hypertension: Secondary | ICD-10-CM | POA: Insufficient documentation

## 2010-09-23 LAB — DIFFERENTIAL
Basophils Relative: 0 % (ref 0–1)
Lymphocytes Relative: 24 % (ref 12–46)
Neutro Abs: 4.3 10*3/uL (ref 1.7–7.7)

## 2010-09-23 LAB — BASIC METABOLIC PANEL
BUN: 15 mg/dL (ref 6–23)
CO2: 29 mEq/L (ref 19–32)
Calcium: 10.7 mg/dL — ABNORMAL HIGH (ref 8.4–10.5)
Chloride: 98 mEq/L (ref 96–112)
Creatinine, Ser: 0.71 mg/dL (ref 0.4–1.2)
Potassium: 3.9 mEq/L (ref 3.5–5.1)
Sodium: 136 mEq/L (ref 135–145)

## 2010-09-23 LAB — TROPONIN I
Troponin I: <0.3 ng/mL (ref <0.30)
Troponin I: <0.3 ng/mL (ref <0.30)

## 2010-09-23 LAB — CK TOTAL AND CKMB (NOT AT ARMC)
CK, MB: 2.5 ng/mL (ref 0.3–4.0)
CK, MB: 2.6 ng/mL (ref 0.3–4.0)
Relative Index: INVALID (ref 0.0–2.5)
Total CK: 66 U/L (ref 7–177)

## 2010-09-23 LAB — CBC
HCT: 42.2 % (ref 36.0–46.0)
MCH: 28.6 pg (ref 26.0–34.0)
MCV: 91.3 fL (ref 78.0–100.0)
Platelets: 212 10*3/uL (ref 150–400)

## 2010-09-28 ENCOUNTER — Encounter: Payer: Self-pay | Admitting: Family Medicine

## 2010-09-28 ENCOUNTER — Ambulatory Visit (INDEPENDENT_AMBULATORY_CARE_PROVIDER_SITE_OTHER): Payer: Medicare Other | Admitting: Family Medicine

## 2010-09-28 VITALS — BP 130/68 | Temp 98.1°F | Wt 172.0 lb

## 2010-09-28 DIAGNOSIS — B029 Zoster without complications: Secondary | ICD-10-CM

## 2010-09-28 MED ORDER — GABAPENTIN (ONCE-DAILY) 300 MG PO TABS: 1.0000 | ORAL_TABLET | Freq: Three times a day (TID) | ORAL | Status: DC

## 2010-09-28 MED ORDER — VALACYCLOVIR HCL 1 G PO TABS: 1000.0000 mg | ORAL_TABLET | Freq: Three times a day (TID) | ORAL | Status: DC

## 2010-09-28 NOTE — Progress Notes (Signed)
  Subjective:    Patient ID: Kathleen Schroeder, female    DOB: May 23, 1930, 77 y.o.   MRN: [id number]  HPI Patient is seen with rash right chest wall region. She had history 9 days ago of some chest and back pain. She went to emergency room and had multiple tests number unremarkable. The following day which was last Wednesday she broke out blistery rash right chest wall and back dermatome around T6. No fever or chills. Pain is severe at x10 out of 10. Sharp burning and stinging pain. She took oxycodone which provided temporary relief.   Review of Systems  Constitutional: Negative for fever, chills, appetite change and unexpected weight change.  Respiratory: Negative for shortness of breath.   Cardiovascular: Negative for chest pain.  Skin: Positive for rash.       Objective:   Physical Exam  Constitutional: She appears well-developed and well-nourished. No distress.  Cardiovascular: Normal rate and regular rhythm.   Pulmonary/Chest: Effort normal and breath sounds normal. No respiratory distress. She has no wheezes. She has no rales.  Musculoskeletal: She exhibits no edema.  Skin: Rash noted.       Patient has rash which extends from the midline of the back all the way to the front of the midline following a dermatome distribution. She has erythematous base with fascicular surface. She has scattered patches          Assessment & Plan:  Shingles. Patient is offered acute pain medication but she defers. She does agree to Valtrex 1 g 3 times a day for 7 days and also gabapentin 300 mg each bedtime and titrate every 3 days up to 3 times a day. Touch base and one to 2 weeks if no improvement. Followup for signs of secondary infection.

## 2010-09-28 NOTE — Patient Instructions (Signed)
Keep skin lesion clean with soap and water. Start gabapentin one at night for 3 nights then increase to one twice daily if needed.  If still in pain at 2 daily may increase in another 3 days to one three times daily.

## 2010-10-01 ENCOUNTER — Other Ambulatory Visit: Payer: Self-pay | Admitting: Family Medicine

## 2010-10-01 MED ORDER — OXYCODONE-ACETAMINOPHEN 5-325 MG PO TABS: 1.0000 | ORAL_TABLET | Freq: Four times a day (QID) | ORAL | Status: DC | PRN

## 2010-10-01 NOTE — Telephone Encounter (Signed)
Son informed he will need to pick-up pain med as it needs to be written/signed

## 2010-10-01 NOTE — Telephone Encounter (Signed)
Pts son called and said that med given to pt for shingles is not helping at all. Getting worse. Pt req a refill of Percocet 5-325 mg or stronger dose asap. Pls call in to [facility] asap today. Pts son is req to be notified, as to status.

## 2010-10-01 NOTE — Telephone Encounter (Signed)
Please advise 

## 2010-10-01 NOTE — Telephone Encounter (Signed)
Percocet 5/325 one po q 6 hours prn pain, disp #30 with no refills.

## 2010-10-05 ENCOUNTER — Other Ambulatory Visit: Payer: Self-pay | Admitting: *Deleted

## 2010-10-05 MED ORDER — METFORMIN HCL 500 MG PO TABS: ORAL_TABLET | ORAL | Status: DC

## 2010-10-20 ENCOUNTER — Encounter: Payer: Self-pay | Admitting: Family Medicine

## 2010-10-20 ENCOUNTER — Ambulatory Visit (INDEPENDENT_AMBULATORY_CARE_PROVIDER_SITE_OTHER): Payer: Medicare Other | Admitting: Family Medicine

## 2010-10-20 VITALS — BP 140/80 | Temp 98.0°F | Wt 170.0 lb

## 2010-10-20 DIAGNOSIS — B0229 Other postherpetic nervous system involvement: Secondary | ICD-10-CM

## 2010-10-20 MED ORDER — OXYCODONE-ACETAMINOPHEN 5-325 MG PO TABS: 1.0000 | ORAL_TABLET | Freq: Four times a day (QID) | ORAL | Status: DC | PRN

## 2010-10-20 NOTE — Progress Notes (Signed)
  Subjective:    Patient ID: Kathleen Schroeder, female    DOB: 1930/10/19, 77 y.o.   MRN: [id number]  HPI Patient seen for followup shingles. Fairly extensive involvement right trunk. She's had severe pain still at times rated 8-9/10. We've started gabapentin currently 300 mg 3 times daily also supplementing with oxycodone 5 mg. She needs refills. Generally sleeping well but has significant daytime pain. Burning and stinging pain. Sore to touch. Rash is healing well.   Review of Systems  Constitutional: Negative for fever, chills, appetite change and fatigue.  Cardiovascular: Negative for chest pain.       Objective:   Physical Exam  Constitutional: She appears well-developed and well-nourished.  Cardiovascular: Normal rate, regular rhythm and normal heart sounds.   Pulmonary/Chest: Effort normal and breath sounds normal. No respiratory distress. She has no wheezes. She has no rales.  Skin:       Rash is healing well no vesicles at this time          Assessment & Plan:  Recent shingles which is healing well but she has significant postherpetic neuralgia. Increase Neurontin to 300 mg 2 tablets in the morning, 1 at lunch, and one at night. Refill oxycodone 5 mg #60 tablets and reviewed possible side effects. Reassess in 3 weeks

## 2010-10-20 NOTE — Patient Instructions (Signed)
Increase gabapentin to 2 in morning and continue with one at lunch and one at night.

## 2010-11-14 ENCOUNTER — Other Ambulatory Visit: Payer: Self-pay | Admitting: Family Medicine

## 2010-12-11 ENCOUNTER — Other Ambulatory Visit: Payer: Self-pay | Admitting: Family Medicine

## 2011-03-04 ENCOUNTER — Encounter: Payer: Self-pay | Admitting: Family Medicine

## 2011-03-06 ENCOUNTER — Other Ambulatory Visit: Payer: Self-pay | Admitting: Family Medicine

## 2011-03-08 ENCOUNTER — Ambulatory Visit: Payer: Medicare Other | Admitting: Family Medicine

## 2011-03-10 ENCOUNTER — Ambulatory Visit (INDEPENDENT_AMBULATORY_CARE_PROVIDER_SITE_OTHER): Payer: Medicare Other | Admitting: Family Medicine

## 2011-03-10 ENCOUNTER — Encounter: Payer: Self-pay | Admitting: Family Medicine

## 2011-03-10 VITALS — BP 150/82 | HR 72 | Temp 98.0°F | Resp 12 | Ht 64.5 in | Wt 170.0 lb

## 2011-03-10 DIAGNOSIS — E785 Hyperlipidemia, unspecified: Secondary | ICD-10-CM

## 2011-03-10 DIAGNOSIS — E119 Type 2 diabetes mellitus without complications: Secondary | ICD-10-CM

## 2011-03-10 DIAGNOSIS — I10 Essential (primary) hypertension: Secondary | ICD-10-CM

## 2011-03-10 LAB — HEMOGLOBIN A1C: Hgb A1c MFr Bld: 7 % — ABNORMAL HIGH (ref 4.6–6.5)

## 2011-03-10 MED ORDER — AMLODIPINE BESY-BENAZEPRIL HCL 5-10 MG PO CAPS: 1.0000 | ORAL_CAPSULE | Freq: Every day | ORAL | Status: DC

## 2011-03-10 NOTE — Progress Notes (Signed)
  Subjective:    Patient ID: Kathleen Schroeder, female    DOB: 09-Sep-1930, 77 y.o.   MRN: [id number]  HPI Medical followup. Patient has history of type 2 diabetes, hyperlipidemia, hypertension, and GERD. Medications are reviewed. Compliant with all. Blood sugars stable fastings mostly low 100s. No hypoglycemia. No symptoms of hyperglycemia.   Patient not monitoring home blood pressures. Denies any headaches or dizziness. No recent chest pain.  Hyperlipidemia treated with simvastatin 20 mg. No myalgias. She has no history of peripheral vascular disease or CAD history.  Past Medical History  Diagnosis Date  . DIABETES MELLITUS, CONTROLLED 09/01/2009  . HYPERLIPIDEMIA 12/01/2009  . ESSENTIAL HYPERTENSION 12/16/2008  . ESOPHAGEAL STRICTURE 12/16/2008  . Heartburn 04/08/2008  . DYSPHAGIA 04/08/2008   Past Surgical History  Procedure Date  . Cholecystectomy   . Abdominal hysterectomy     reports that she has never smoked. She does not have any smokeless tobacco history on file. Her alcohol and drug histories not on file. family history includes Cancer in her sister and Stroke in her mother. No Known Allergies    Review of Systems  Constitutional: Negative for fatigue.  Eyes: Negative for visual disturbance.  Respiratory: Negative for cough, chest tightness, shortness of breath and wheezing.   Cardiovascular: Negative for chest pain, palpitations and leg swelling.  Neurological: Negative for dizziness, seizures, syncope, weakness, light-headedness and headaches.       Objective:   Physical Exam  Constitutional: She appears well-developed and well-nourished.  HENT:  Mouth/Throat: Oropharynx is clear and moist.  Neck: Neck supple. No thyromegaly present.  Cardiovascular: Normal rate and regular rhythm.   Pulmonary/Chest: Effort normal and breath sounds normal. No respiratory distress. She has no wheezes. She has no rales.  Musculoskeletal: She exhibits no edema.  Lymphadenopathy:   She has no cervical adenopathy.  Psychiatric: She has a normal mood and affect. Her behavior is normal.          Assessment & Plan:  #1 type 2 diabetes. History of marginal control. Recheck A1c #2 hypertension suboptimally controlled. Discontinue amlodipine and start Lotrel 5/10 mg one daily and reassess blood pressure within 2 months  #3 hyperlipidemia. Recent lipids at goal. Continue simvastatin 20 mg daily.  #4 health maintenance. Patient declines flu vaccine

## 2011-03-12 NOTE — Progress Notes (Signed)
Quick Note:  Pt aware ______ 

## 2011-03-16 ENCOUNTER — Other Ambulatory Visit: Payer: Self-pay | Admitting: Family Medicine

## 2011-05-10 ENCOUNTER — Encounter: Payer: Self-pay | Admitting: Family Medicine

## 2011-05-10 ENCOUNTER — Ambulatory Visit (INDEPENDENT_AMBULATORY_CARE_PROVIDER_SITE_OTHER): Payer: Medicare Other | Admitting: Family Medicine

## 2011-05-10 VITALS — BP 118/70 | HR 70 | Temp 98.3°F | Resp 12 | Wt 170.0 lb

## 2011-05-10 DIAGNOSIS — I10 Essential (primary) hypertension: Secondary | ICD-10-CM

## 2011-05-10 NOTE — Progress Notes (Signed)
  Subjective:    Patient ID: Kathleen Schroeder, female    DOB: 09/05/1930, 78 y.o.   MRN: [id number]  HPI  Followup hypertension.  Recent change from amlodipine to Lotrel 5/10 mg. For past week has had mild cough but no chronic cough with change of medication. Denies any recent dizziness. No chest pains. No shortness of breath. Not monitoring blood pressure at home. No headaches.  Review of Systems  Constitutional: Negative for fatigue.  Eyes: Negative for visual disturbance.  Respiratory: Negative for cough, chest tightness, shortness of breath and wheezing.   Cardiovascular: Negative for chest pain, palpitations and leg swelling.  Neurological: Negative for dizziness, seizures, syncope, weakness, light-headedness and headaches.       Objective:   Physical Exam  Constitutional: She appears well-developed and well-nourished.  Neck: Neck supple. No thyromegaly present.  Cardiovascular: Normal rate and regular rhythm.   Pulmonary/Chest: Effort normal and breath sounds normal. No respiratory distress. She has no wheezes. She has no rales.  Musculoskeletal: She exhibits no edema.          Assessment & Plan:  Hypertension. Improved. Continue current medications. Routine followup 4 months and repeat lab work then including A1c and basic metabolic panel

## 2011-07-14 ENCOUNTER — Other Ambulatory Visit: Payer: Self-pay | Admitting: Family Medicine

## 2011-09-06 ENCOUNTER — Ambulatory Visit: Payer: Medicare Other | Admitting: Family Medicine

## 2011-09-08 ENCOUNTER — Ambulatory Visit (INDEPENDENT_AMBULATORY_CARE_PROVIDER_SITE_OTHER): Payer: Medicare Other | Admitting: Family Medicine

## 2011-09-08 ENCOUNTER — Encounter: Payer: Self-pay | Admitting: Family Medicine

## 2011-09-08 VITALS — BP 100/60 | Temp 98.3°F | Wt 170.0 lb

## 2011-09-08 DIAGNOSIS — E119 Type 2 diabetes mellitus without complications: Secondary | ICD-10-CM

## 2011-09-08 DIAGNOSIS — E785 Hyperlipidemia, unspecified: Secondary | ICD-10-CM

## 2011-09-08 DIAGNOSIS — I10 Essential (primary) hypertension: Secondary | ICD-10-CM

## 2011-09-08 LAB — HEPATIC FUNCTION PANEL
ALT: 51 U/L — ABNORMAL HIGH (ref 0–35)
Alkaline Phosphatase: 55 U/L (ref 39–117)
Bilirubin, Direct: 0.1 mg/dL (ref 0.0–0.3)
Total Bilirubin: 0.5 mg/dL (ref 0.3–1.2)
Total Protein: 7 g/dL (ref 6.0–8.3)

## 2011-09-08 LAB — BASIC METABOLIC PANEL
BUN: 16 mg/dL (ref 6–23)
CO2: 29 mEq/L (ref 19–32)
Calcium: 9.4 mg/dL (ref 8.4–10.5)
GFR: 69.51 mL/min (ref >60.00)
Glucose, Bld: 171 mg/dL — ABNORMAL HIGH (ref 70–99)
Potassium: 4.3 mEq/L (ref 3.5–5.1)

## 2011-09-08 LAB — LIPID PANEL: Cholesterol: 135 mg/dL (ref 0–200)

## 2011-09-08 NOTE — Progress Notes (Signed)
  Subjective:    Patient ID: Kathleen Schroeder, female    DOB: 1930/11/13, 78 y.o.   MRN: [id number]  HPI  Medical followup. Patient has history of type 2 diabetes, hypertension, hyperlipidemia, and GERD. Prior history of esophageal stricture but no recent dysphagia. Medications reviewed. Compliant  with all. No recent hypoglycemic symptoms. Fasting blood sugars around 113- 135. Last A1c 7.0%. She is getting yearly eye exams.  Hypertension treated with 2 drug regimen. Rare episodes of dizziness with standing. No syncope.  Dry cough for 5 weeks. No history of smoking. Denies any fevers. No dyspnea. Denies any postnasal drip, GERD, or wheezing. No hemoptysis. Occasional nasal congestion.  Past Medical History  Diagnosis Date  . DIABETES MELLITUS, CONTROLLED 09/01/2009  . HYPERLIPIDEMIA 12/01/2009  . ESSENTIAL HYPERTENSION 12/16/2008  . ESOPHAGEAL STRICTURE 12/16/2008  . Heartburn 04/08/2008  . DYSPHAGIA 04/08/2008   Past Surgical History  Procedure Date  . Cholecystectomy   . Abdominal hysterectomy     reports that she has never smoked. She does not have any smokeless tobacco history on file. Her alcohol and drug histories not on file. family history includes Cancer in her sister and Stroke in her mother. No Known Allergies    Review of Systems  Constitutional: Negative for fatigue.  Eyes: Negative for visual disturbance.  Respiratory: Negative for cough, chest tightness, shortness of breath and wheezing.   Cardiovascular: Negative for chest pain, palpitations and leg swelling.  Neurological: Negative for dizziness, seizures, syncope, weakness, light-headedness and headaches.       Objective:   Physical Exam  Constitutional: She is oriented to person, place, and time. She appears well-developed and well-nourished.  HENT:  Mouth/Throat: Oropharynx is clear and moist.  Neck: Neck supple. No thyromegaly present.  Cardiovascular: Normal rate and regular rhythm.   Pulmonary/Chest:  Effort normal and breath sounds normal. No respiratory distress. She has no wheezes. She has no rales.  Musculoskeletal: She exhibits no edema.       Feet reveal no skin lesions. Good distal foot pulses. Good capillary refill. No calluses. Normal sensation with monofilament testing   Neurological: She is alert and oriented to person, place, and time.          Assessment & Plan:  #1 type 2 diabetes. Recheck A1c  #2 hypertension. Well controlled. May need to decrease medication if blood pressure remains on low side. Increase hydration status. Check basic metabolic panel #3 hyperlipidemia. Check lipid and hepatic panel  #4 history of GERD stable continue Nexium  #5 dry cough. Nonfocal exam. Chest x-ray if not resolving next couple of weeks

## 2011-09-09 NOTE — Progress Notes (Signed)
Quick Note:  Pt phone not accepting calls?, no answer several attempts ______

## 2011-09-09 NOTE — Progress Notes (Signed)
Quick Note:  Pt called back, result given ______

## 2011-10-05 ENCOUNTER — Telehealth: Payer: Self-pay | Admitting: Family Medicine

## 2011-10-05 ENCOUNTER — Ambulatory Visit (INDEPENDENT_AMBULATORY_CARE_PROVIDER_SITE_OTHER): Payer: Medicare Other | Admitting: Family Medicine

## 2011-10-05 ENCOUNTER — Encounter: Payer: Self-pay | Admitting: Family Medicine

## 2011-10-05 ENCOUNTER — Encounter (INDEPENDENT_AMBULATORY_CARE_PROVIDER_SITE_OTHER): Payer: Medicare Other

## 2011-10-05 VITALS — BP 140/70 | Temp 97.8°F | Wt 170.0 lb

## 2011-10-05 DIAGNOSIS — R600 Localized edema: Secondary | ICD-10-CM

## 2011-10-05 DIAGNOSIS — M79609 Pain in unspecified limb: Secondary | ICD-10-CM

## 2011-10-05 DIAGNOSIS — M79605 Pain in left leg: Secondary | ICD-10-CM

## 2011-10-05 DIAGNOSIS — R609 Edema, unspecified: Secondary | ICD-10-CM

## 2011-10-05 DIAGNOSIS — M7989 Other specified soft tissue disorders: Secondary | ICD-10-CM

## 2011-10-05 NOTE — Telephone Encounter (Signed)
Pts son called and is trying to see what transpired during his mom ov today re: leg and foot pain. Wants to know if his mom was referred to a specialist.

## 2011-10-05 NOTE — Progress Notes (Signed)
  Subjective:    Patient ID: Kathleen Schroeder, female    DOB: 1930-11-14, 78 y.o.   MRN: [id number]  HPI  Patient see with left leg edema. Onset about 4 days ago. No injury. Mild calf pain. Denies any dyspnea or chest pains. No history of DVT. No knee pain. No popliteal swelling behind the knee. Denies any fever or chills. No cellulitis.  Past Medical History  Diagnosis Date  . DIABETES MELLITUS, CONTROLLED 09/01/2009  . HYPERLIPIDEMIA 12/01/2009  . ESSENTIAL HYPERTENSION 12/16/2008  . ESOPHAGEAL STRICTURE 12/16/2008  . Heartburn 04/08/2008  . DYSPHAGIA 04/08/2008   Past Surgical History  Procedure Date  . Cholecystectomy   . Abdominal hysterectomy     reports that she has never smoked. She does not have any smokeless tobacco history on file. Her alcohol and drug histories not on file. family history includes Cancer in her sister and Stroke in her mother. No Known Allergies    Review of Systems  Constitutional: Negative for fever and chills.  Respiratory: Negative for cough and shortness of breath.   Cardiovascular: Positive for leg swelling. Negative for chest pain and palpitations.  Hematological: Negative for adenopathy. Does not bruise/bleed easily.       Objective:   Physical Exam  Constitutional: She is oriented to person, place, and time. She appears well-developed and well-nourished.  Neck: Neck supple. No JVD present.  Cardiovascular: Normal rate and regular rhythm.   Pulmonary/Chest: Effort normal and breath sounds normal. No respiratory distress. She has no wheezes. She has no rales.  Musculoskeletal: She exhibits edema.       Patient some asymmetric leg edema left greater than right. A similar location 21.5 cm right calf and 22.5 cm left calf measuring 10 cm above the ankle. She also has some slight tenderness palpating left posterior calf region. No color changes. Good distal foot pulses. Both feet are warm to touch with good capillary refill. Full range of motion  left knee  Neurological: She is alert and oriented to person, place, and time.          Assessment & Plan:  Asymmetric leg edema. Rule out DVT left leg. Venous Dopplers hopefully later today.

## 2011-10-06 NOTE — Telephone Encounter (Signed)
Per verbal report, no evidence for DVT.  I would not change meds at this time unless swelling progresses.  Elevate leg frequently.

## 2011-10-06 NOTE — Telephone Encounter (Signed)
Son Barbara Cower is pt emergency contact, husband has DPR on chart

## 2011-10-06 NOTE — Telephone Encounter (Signed)
Pt informed we need DPR on her chart for her son, and we can do that at any time, at OV or we can send to her home.  She wanted to know if she can take bayer asa 81 mg for pain.  Per Dr Caryl Never, she can take up to 3 for pain and add tylenol if needed for pain control.  She was instructed to call if this is not controlling her discomfort.  She is elevating in a lazy boy often

## 2011-10-06 NOTE — Telephone Encounter (Signed)
I will send a list of pt meds to her home, do you want to add any additional message?

## 2011-10-06 NOTE — Telephone Encounter (Signed)
Pts son called and said that he is aware that his mom went to cardiologist yesterday for Doppler and nothing abnormal was found. Need to know what meds pt should be taking. Pls call.

## 2011-10-23 ENCOUNTER — Other Ambulatory Visit: Payer: Self-pay | Admitting: Family Medicine

## 2011-11-13 ENCOUNTER — Other Ambulatory Visit: Payer: Self-pay | Admitting: Family Medicine

## 2011-12-11 ENCOUNTER — Other Ambulatory Visit: Payer: Self-pay | Admitting: Family Medicine

## 2011-12-31 ENCOUNTER — Encounter: Payer: Self-pay | Admitting: Internal Medicine

## 2012-01-18 ENCOUNTER — Other Ambulatory Visit: Payer: Self-pay | Admitting: Family Medicine

## 2012-01-25 ENCOUNTER — Encounter: Payer: Self-pay | Admitting: Internal Medicine

## 2012-03-09 ENCOUNTER — Other Ambulatory Visit: Payer: Self-pay | Admitting: Family Medicine

## 2012-03-10 ENCOUNTER — Ambulatory Visit (AMBULATORY_SURGERY_CENTER): Payer: Medicare Other | Admitting: *Deleted

## 2012-03-10 ENCOUNTER — Ambulatory Visit: Payer: Medicare Other | Admitting: Family Medicine

## 2012-03-10 ENCOUNTER — Encounter: Payer: Self-pay | Admitting: Internal Medicine

## 2012-03-10 VITALS — Ht 63.75 in | Wt 170.0 lb

## 2012-03-10 DIAGNOSIS — K222 Esophageal obstruction: Secondary | ICD-10-CM

## 2012-03-15 ENCOUNTER — Ambulatory Visit (INDEPENDENT_AMBULATORY_CARE_PROVIDER_SITE_OTHER): Payer: Medicare Other | Admitting: Family Medicine

## 2012-03-15 ENCOUNTER — Encounter: Payer: Self-pay | Admitting: Family Medicine

## 2012-03-15 VITALS — BP 112/62 | Temp 97.7°F | Wt 177.0 lb

## 2012-03-15 DIAGNOSIS — I10 Essential (primary) hypertension: Secondary | ICD-10-CM

## 2012-03-15 DIAGNOSIS — E119 Type 2 diabetes mellitus without complications: Secondary | ICD-10-CM

## 2012-03-15 DIAGNOSIS — E785 Hyperlipidemia, unspecified: Secondary | ICD-10-CM

## 2012-03-15 NOTE — Progress Notes (Signed)
  Subjective:    Patient ID: Kathleen Schroeder, female    DOB: December 22, 1930, 78 y.o.   MRN: [id number]  HPI  Medical followup. Patient has type 2 diabetes, hypertension, hyperlipidemia, and GERD with history of esophageal stricture. She's had some occasional dysphagia recently and has scheduled followup with GI in December. Compliant with medications. Blood sugars fasting around 115 -125. No hypoglycemia. No symptoms of hyperglycemia. Blood pressures been well controlled with no orthostasis. No chest pains. No dyspnea.  Patient declines flu vaccine. Nonsmoker. Stays active. No balance issues and no recent falls.  Past Medical History  Diagnosis Date  . HYPERLIPIDEMIA 12/01/2009  . ESSENTIAL HYPERTENSION 12/16/2008  . ESOPHAGEAL STRICTURE 12/16/2008  . Heartburn 04/08/2008  . DYSPHAGIA 04/08/2008  . Cataract   . Depression   . DIABETES MELLITUS, CONTROLLED 09/01/2009  . GERD (gastroesophageal reflux disease)   . Glaucoma    Past Surgical History  Procedure Date  . Cholecystectomy   . Abdominal hysterectomy 1984    reports that she has never smoked. She has never used smokeless tobacco. She reports that she does not drink alcohol or use illicit drugs. family history includes Cancer in her sister and Stroke in her mother. No Known Allergies    Review of Systems  Constitutional: Negative for fatigue and unexpected weight change.  Eyes: Negative for visual disturbance.  Respiratory: Negative for cough, chest tightness, shortness of breath and wheezing.   Cardiovascular: Negative for chest pain, palpitations and leg swelling.  Neurological: Negative for dizziness, seizures, syncope, weakness, light-headedness and headaches.       Objective:   Physical Exam  Constitutional: She is oriented to person, place, and time. She appears well-developed and well-nourished.  Neck: Neck supple. No thyromegaly present.  Cardiovascular: Normal rate and regular rhythm.  Exam reveals no gallop.     Pulmonary/Chest: Effort normal and breath sounds normal. No respiratory distress. She has no wheezes. She has no rales.  Musculoskeletal: She exhibits no edema.       Feet reveal no skin lesions. Good distal foot pulses. Good capillary refill. No calluses. Normal sensation with monofilament testing   Neurological: She is alert and oriented to person, place, and time.          Assessment & Plan:  #1 type 2 diabetes. History of good control. Recheck A1c. Continue yearly eye exams #2 hypertension. Adequate control. #3 hyperlipidemia. Lipids controlled at visit last spring. Will check on yearly basis.

## 2012-03-28 ENCOUNTER — Telehealth: Payer: Self-pay | Admitting: Family Medicine

## 2012-03-28 ENCOUNTER — Encounter: Payer: Self-pay | Admitting: Internal Medicine

## 2012-03-28 ENCOUNTER — Ambulatory Visit (AMBULATORY_SURGERY_CENTER): Payer: Medicare Other | Admitting: Internal Medicine

## 2012-03-28 VITALS — BP 145/79 | HR 72 | Temp 97.8°F | Resp 21 | Ht 63.0 in | Wt 170.0 lb

## 2012-03-28 DIAGNOSIS — K2990 Gastroduodenitis, unspecified, without bleeding: Secondary | ICD-10-CM

## 2012-03-28 DIAGNOSIS — K2970 Gastritis, unspecified, without bleeding: Secondary | ICD-10-CM

## 2012-03-28 DIAGNOSIS — K2270 Barrett's esophagus without dysplasia: Secondary | ICD-10-CM

## 2012-03-28 DIAGNOSIS — R1319 Other dysphagia: Secondary | ICD-10-CM

## 2012-03-28 DIAGNOSIS — K222 Esophageal obstruction: Secondary | ICD-10-CM

## 2012-03-28 MED ORDER — SODIUM CHLORIDE 0.9 % IV SOLN: 500.0000 mL | INTRAVENOUS | Status: DC

## 2012-03-28 NOTE — Progress Notes (Signed)
Called to room to assist during endoscopic procedure.  Patient ID and intended procedure confirmed with present staff. Received instructions for my participation in the procedure from the performing physician.  

## 2012-03-28 NOTE — Progress Notes (Signed)
Right bundle branch block noted in recovery. Pt. Denies any prior problems.  Dr. Juanda Chance notified.  Advised to have pt. See Dr. Caryl Never With copy of lead. Pt. Verbalized understanding.

## 2012-03-28 NOTE — Patient Instructions (Addendum)
YOU HAD AN ENDOSCOPIC PROCEDURE TODAY AT THE [facility]: Refer to the procedure report that was given to you for any specific questions about what was found during the examination.  If the procedure report does not answer your questions, please call your gastroenterologist to clarify.  If you requested that your care partner not be given the details of your procedure findings, then the procedure report has been included in a sealed envelope for you to review at your convenience later.  YOU SHOULD EXPECT: Some feelings of bloating in the abdomen. Passage of more gas than usual.  Walking can help get rid of the air that was put into your GI tract during the procedure and reduce the bloating. If you had a lower endoscopy (such as a colonoscopy or flexible sigmoidoscopy) you may notice spotting of blood in your stool or on the toilet paper. If you underwent a bowel prep for your procedure, then you may not have a normal bowel movement for a few days.  DIET: Your first meal following the procedure should be a light meal and then it is ok to progress to your normal diet.  A half-sandwich or bowl of soup is an example of a good first meal.  Heavy or fried foods are harder to digest and may make you feel nauseous or bloated.  Likewise meals heavy in dairy and vegetables can cause extra gas to form and this can also increase the bloating.  Drink plenty of fluids but you should avoid alcoholic beverages for 24 hours.  ACTIVITY: Your care partner should take you home directly after the procedure.  You should plan to take it easy, moving slowly for the rest of the day.  You can resume normal activity the day after the procedure however you should NOT DRIVE or use heavy machinery for 24 hours (because of the sedation medicines used during the test).    SYMPTOMS TO REPORT IMMEDIATELY: A gastroenterologist can be reached at any hour.  During normal business hours, 8:30 AM to 5:00 PM Monday through Friday,  call [phone].  After hours and on weekends, please call the GI answering service at [phone] who will take a message and have the physician on call contact you.    Following upper endoscopy (EGD)  Vomiting of blood or coffee ground material  New chest pain or pain under the shoulder blades  Painful or persistently difficult swallowing  New shortness of breath  Fever of 100F or higher  Black, tarry-looking stools  FOLLOW UP: If any biopsies were taken you will be contacted by phone or by letter within the next 1-3 weeks.  Call your gastroenterologist if you have not heard about the biopsies in 3 weeks.  Our staff will call the home number listed on your records the next business day following your procedure to check on you and address any questions or concerns that you may have at that time regarding the information given to you following your procedure. This is a courtesy call and so if there is no answer at the home number and we have not heard from you through the emergency physician on call, we will assume that you have returned to your regular daily activities without incident.  SIGNATURES/CONFIDENTIALITY: You and/or your care partner have signed paperwork which will be entered into your electronic medical record.  These signatures attest to the fact that that the information above on your After Visit Summary has been reviewed and is understood.  Full   responsibility of the confidentiality of this discharge information lies with you and/or your care-partner.    Esophageal stricture, gastritis,and hiatal hernia information given.  Post dilation diet given with instructions.    Continue anti reflux meds and PPI.  Follow up with Dr. Caryl Never per Dr. Regino Schultze advice.  EKG showed sinus rhythm with Right bundle branch block.

## 2012-03-28 NOTE — Op Note (Signed)
[facility] [address] [place], [address]   ENDOSCOPY PROCEDURE REPORT  PATIENT: Kathleen Schroeder, Kathleen Schroeder  MR#: [id number] BIRTHDATE: January 29, 1931 , 81  yrs. old GENDER: Female ENDOSCOPIST: Hart Carwin, MD REFERRED BY:  Evelena Peat, M.D. PROCEDURE DATE:  03/28/2012 PROCEDURE:  Savary dilation of esophagus and EGD w/ biopsy ASA CLASS:     Class II INDICATIONS:  Dysphagia.   history of Barrett's esophagus. 04/2008-EGD with dil, Barrett's esophagus. MEDICATIONS: MAC sedation, administered by CRNA and propofol (Diprivan) 200mg  IV TOPICAL ANESTHETIC: none  DESCRIPTION OF PROCEDURE: After the risks benefits and alternatives of the procedure were thoroughly explained, informed consent was obtained.  The [facility] [id number] endoscope was introduced through the mouth and advanced to the second portion of the duodenum. Without limitations.  The instrument was slowly withdrawn as the mucosa was fully examined.        ESOPHAGUS: A stricture was found.11-12 mm stricture found at the gastroesophageal junction. It was traversed without resistance. There was no bleeding from the stricture. Mucosa proximal to the stricture appeared normal. Distal to the stricture was a 3 cm nonreducible hiatal hernia with focal gastritis .Polyp-like mucosa was biopsied from cardia of the stomach  Retroflexed views revealed no abnormalities. Savary dilators were used to dilate the stricture starting the 13 mm, 14 mm, 15 mm and 16 mm dilators. There was small amount of blood on the last dilator    The scope was then withdrawn from the patient and the procedure completed.  COMPLICATIONS: There were no complications. ENDOSCOPIC IMPRESSION:  mild to moderate distal esophageal stricture. It appears benign. Status post dilation from 13-16 mm. 3 cm nonreducible hiatal hernia Focal gastritis and hiatal hernia status post biopsies to rule out Barrett's esophagus Stricture was  found  RECOMMENDATIONS: 1.  Await pathology results 2.  Anti-reflux regimen to be follow 3.  Continue PPI  REPEAT EXAM: recall endoscopy and dilas needed  eSigned:  Hart Carwin, MD 03/28/2012 9:25 AM   CC:  PATIENT NAME:  Doha, Boling MR#: [id number]

## 2012-03-28 NOTE — Progress Notes (Signed)
Patient did not experience any of the following events: a burn prior to discharge; a fall within the facility; wrong site/side/patient/procedure/implant event; or a hospital transfer or hospital admission upon discharge from the facility. (G8907) Patient did not have preoperative order for IV antibiotic SSI prophylaxis. (G8918)  

## 2012-03-28 NOTE — Telephone Encounter (Signed)
Patient Information:  Caller Name: Barbara Cower  Phone: [phone]  Patient: Kathleen Schroeder, Kathleen Schroeder  Gender: Female  DOB: April 19, 1931  Age: 78 Years  PCP: Evelena Peat (Family Practice)   Symptoms  Reason For Call & Symptoms: was informed by Dr.Brody that heart was irregular during an endoscopic procedure today; was told to see PCP  Reviewed Health History In EMR: Yes  Reviewed Medications In EMR: Yes  Reviewed Allergies In EMR: Yes  Reviewed Surgeries / Procedures: Yes  Date of Onset of Symptoms: 03/28/2012  Guideline(s) Used:  Heart Rate and Heartbeat Questions  Disposition Per Guideline:   See Today in Office  Reason For Disposition Reached:   Age > 60 years  Advice Given:  Health Basics  Sleep: Try to get sufficient amount of sleep. Lack of sleep can aggravate palpitations. Most people need 7-8 hours of sleep each night.  Exercise: Regular exercise will improve your overall health, improve your mood, and is a simple method to reduce stress.  Diet: Eat a balanced healthy diet.  Liquid Intake: Drink adequate liquids, 6-8 glasses of water daily.  Avoid Caffeine  Avoid caffeine-containing beverages (Reason: caffeine is a stimulant and can aggravate palpitations).  Call Back If:  Chest pain, lightheadedness, or difficulty breathing occurs  More than 3 extra or skipped beats / minute  You become worse.  Office Follow Up:  Does the office need to follow up with this patient?: No  Instructions For The Office: N/A  Appointment Scheduled:  03/30/2012 09:45:00 Appointment Scheduled Provider:  Evelena Peat (Family Practice)

## 2012-03-29 ENCOUNTER — Telehealth: Payer: Self-pay

## 2012-03-29 ENCOUNTER — Ambulatory Visit (INDEPENDENT_AMBULATORY_CARE_PROVIDER_SITE_OTHER): Payer: Medicare Other | Admitting: Family Medicine

## 2012-03-29 ENCOUNTER — Encounter: Payer: Self-pay | Admitting: Family Medicine

## 2012-03-29 VITALS — BP 122/72 | Temp 98.0°F | Wt 177.0 lb

## 2012-03-29 DIAGNOSIS — E119 Type 2 diabetes mellitus without complications: Secondary | ICD-10-CM

## 2012-03-29 DIAGNOSIS — I4510 Unspecified right bundle-branch block: Secondary | ICD-10-CM

## 2012-03-29 DIAGNOSIS — I10 Essential (primary) hypertension: Secondary | ICD-10-CM

## 2012-03-29 DIAGNOSIS — R002 Palpitations: Secondary | ICD-10-CM

## 2012-03-29 NOTE — Telephone Encounter (Signed)
Unable to leave message no voice mail set-up. Would not message to be left.

## 2012-03-29 NOTE — Patient Instructions (Addendum)
Follow up promptly for any dizziness, recurrent palpitations, or any chest pain.

## 2012-03-29 NOTE — Progress Notes (Signed)
  Subjective:    Patient ID: Kathleen Schroeder, female    DOB: Apr 13, 1931, 78 y.o.   MRN: [id number]  HPI  Patient seen with right bundle-branch block noted yesterday during endoscopy. She had esophageal stricture and underwent dilatation without difficulty. Patient has rare episodes of brief palpitations but has never had any chest pain or dizziness. No syncope. In reviewing her record, she had EKG back in June 2012 right bundle branch block then. She's had no recent chest pains. Overall feels well. No cardiac history. She has type 2 diabetes which has been fairly well controlled.  Past Medical History  Diagnosis Date  . HYPERLIPIDEMIA 12/01/2009  . ESSENTIAL HYPERTENSION 12/16/2008  . ESOPHAGEAL STRICTURE 12/16/2008  . Heartburn 04/08/2008  . DYSPHAGIA 04/08/2008  . Cataract   . Depression   . DIABETES MELLITUS, CONTROLLED 09/01/2009  . GERD (gastroesophageal reflux disease)   . Glaucoma    Past Surgical History  Procedure Date  . Cholecystectomy   . Abdominal hysterectomy 1984    reports that she has never smoked. She has never used smokeless tobacco. She reports that she does not drink alcohol or use illicit drugs. family history includes Cancer in her sister and Stroke in her mother. No Known Allergies    Review of Systems  Constitutional: Negative for appetite change and unexpected weight change.  Respiratory: Negative for cough and shortness of breath.   Cardiovascular: Negative for chest pain and leg swelling. Palpitations: rare.       Objective:   Physical Exam  Constitutional: She appears well-developed and well-nourished.  Cardiovascular: Normal rate and regular rhythm.  Exam reveals no gallop.   Pulmonary/Chest: Effort normal and breath sounds normal. No respiratory distress. She has no wheezes. She has no rales.  Musculoskeletal: She exhibits no edema.          Assessment & Plan:  Rare palpitations. No other concerning symptoms. EKG today shows right bundle  branch block which is not new. We've not recommended any further evaluation today since she is asymptomatic with right bundle branch block which is not new  Hypertension well controlled.  Type 2 DM with hx of good control.

## 2012-03-29 NOTE — Telephone Encounter (Signed)
FYI

## 2012-03-30 ENCOUNTER — Ambulatory Visit: Payer: Self-pay | Admitting: Family Medicine

## 2012-04-03 ENCOUNTER — Encounter: Payer: Self-pay | Admitting: Internal Medicine

## 2012-04-14 ENCOUNTER — Other Ambulatory Visit: Payer: Self-pay | Admitting: *Deleted

## 2012-04-14 MED ORDER — GLIMEPIRIDE 2 MG PO TABS: 4.0000 mg | ORAL_TABLET | Freq: Every day | ORAL | Status: DC

## 2012-07-10 ENCOUNTER — Other Ambulatory Visit: Payer: Self-pay | Admitting: Family Medicine

## 2012-09-13 ENCOUNTER — Encounter: Payer: Self-pay | Admitting: Family Medicine

## 2012-09-13 ENCOUNTER — Ambulatory Visit (INDEPENDENT_AMBULATORY_CARE_PROVIDER_SITE_OTHER): Payer: Medicare Other | Admitting: Family Medicine

## 2012-09-13 VITALS — BP 144/78 | Temp 98.2°F | Wt 172.0 lb

## 2012-09-13 DIAGNOSIS — E119 Type 2 diabetes mellitus without complications: Secondary | ICD-10-CM

## 2012-09-13 DIAGNOSIS — E785 Hyperlipidemia, unspecified: Secondary | ICD-10-CM

## 2012-09-13 DIAGNOSIS — I10 Essential (primary) hypertension: Secondary | ICD-10-CM

## 2012-09-13 LAB — LIPID PANEL
Cholesterol: 153 mg/dL (ref 0–200)
LDL Cholesterol: 62 mg/dL (ref 0–99)
Triglycerides: 182 mg/dL — ABNORMAL HIGH (ref 0.0–149.0)
VLDL: 36.4 mg/dL (ref 0.0–40.0)

## 2012-09-13 LAB — HEPATIC FUNCTION PANEL
Albumin: 3.8 g/dL (ref 3.5–5.2)
Total Protein: 7.4 g/dL (ref 6.0–8.3)

## 2012-09-13 LAB — BASIC METABOLIC PANEL
Chloride: 101 mEq/L (ref 96–112)
Potassium: 3.8 mEq/L (ref 3.5–5.1)

## 2012-09-13 LAB — HM DIABETES FOOT EXAM: HM Diabetic Foot Exam: NORMAL

## 2012-09-13 NOTE — Progress Notes (Signed)
  Subjective:    Patient ID: Kathleen Schroeder, female    DOB: Jan 22, 1931, 79 y.o.   MRN: [id number]  HPI  Patient here for followup multiple medical problems  Type 2 diabetes. Last A1c 7.4%. We increased her Amaryl to 4 mg daily been No recent hypoglycemia. Fasting blood sugars consistently between 80 and 120  Hypertension treated with Ziac and Lotrel. Blood pressures been stable No headaches or dizziness. Compliant with medications  Hyperlipidemia treated with Zocor. No myalgias. No recent falls. GERD stable on Nexium  Past Medical History  Diagnosis Date  . HYPERLIPIDEMIA 12/01/2009  . ESSENTIAL HYPERTENSION 12/16/2008  . ESOPHAGEAL STRICTURE 12/16/2008  . Heartburn 04/08/2008  . DYSPHAGIA 04/08/2008  . Cataract   . Depression   . DIABETES MELLITUS, CONTROLLED 09/01/2009  . GERD (gastroesophageal reflux disease)   . Glaucoma    Past Surgical History  Procedure Laterality Date  . Cholecystectomy    . Abdominal hysterectomy  1984    reports that she has never smoked. She has never used smokeless tobacco. She reports that she does not drink alcohol or use illicit drugs. family history includes Cancer in her sister and Stroke in her mother. No Known Allergies   Review of Systems  Constitutional: Negative for fatigue.  Eyes: Negative for visual disturbance.  Respiratory: Negative for cough, chest tightness, shortness of breath and wheezing.   Cardiovascular: Negative for chest pain, palpitations and leg swelling.  Neurological: Negative for dizziness, seizures, syncope, weakness, light-headedness and headaches.       Objective:   Physical Exam  Constitutional: She appears well-developed and well-nourished.  HENT:  Right Ear: External ear normal.  Left Ear: External ear normal.  Mouth/Throat: Oropharynx is clear and moist.  Neck: Neck supple.  Cardiovascular: Normal rate and regular rhythm.   Musculoskeletal: She exhibits no edema.  Skin:  Feet reveal no skin lesions.  Good distal foot pulses. Good capillary refill. No calluses. Normal sensation with monofilament testing   Psychiatric: She has a normal mood and affect. Her behavior is normal.          Assessment & Plan:  #1 hypertension. Stable. Continue current medications. Check basic metabolic panel #2 type 2 diabetes. History of fair control. Recheck A1c. She'll be scheduling repeat eye exam soon #3 dyslipidemia. Check lipid and hepatic panel

## 2012-09-14 NOTE — Progress Notes (Signed)
Quick Note:  Pt informed ______ 

## 2012-09-20 ENCOUNTER — Telehealth: Payer: Self-pay | Admitting: Family Medicine

## 2012-09-20 NOTE — Telephone Encounter (Signed)
She should be taking Amaryl 4 mg daily.

## 2012-09-20 NOTE — Telephone Encounter (Signed)
Patient notified that she should NOT be taking 3 tabs daily but only two.  Asked if she needed a new rx.  She declined.  Instructed her to call back when a new one is needed.

## 2012-09-20 NOTE — Telephone Encounter (Signed)
[employer], whom is a friend of the PT is requesting a refill of the pt's glimepiride (AMARYL) 2 MG tablet. The instructions stated to take 2 tablets a day, but pt was later instructed to take 3 tablets a day. Therefor her 90 day supply, has ran out early, and needs refilled with new instructions. Aurther Loft is requesting that this be sent to [facility], [phone]) [phone]. Please assist.

## 2012-09-20 NOTE — Telephone Encounter (Signed)
I do not see where this was increased to 3 tabs daily.  Last note says increase to 4mg .  Please advise.  Thanks!!

## 2012-10-01 ENCOUNTER — Encounter (HOSPITAL_COMMUNITY): Payer: Self-pay | Admitting: Emergency Medicine

## 2012-10-01 ENCOUNTER — Emergency Department (HOSPITAL_COMMUNITY)
Admission: EM | Admit: 2012-10-01 | Discharge: 2012-10-02 | Disposition: A | Discharge status: Home / Self Care | Payer: Medicare Other | Attending: Internal Medicine | Admitting: Internal Medicine

## 2012-10-01 ENCOUNTER — Encounter (HOSPITAL_COMMUNITY)
Admission: EM | Disposition: A | Discharge status: Home / Self Care | Payer: Self-pay | Source: Home / Self Care | Attending: Emergency Medicine

## 2012-10-01 DIAGNOSIS — K222 Esophageal obstruction: Secondary | ICD-10-CM | POA: Insufficient documentation

## 2012-10-01 DIAGNOSIS — T18108A Unspecified foreign body in esophagus causing other injury, initial encounter: Secondary | ICD-10-CM | POA: Insufficient documentation

## 2012-10-01 DIAGNOSIS — Z79899 Other long term (current) drug therapy: Secondary | ICD-10-CM | POA: Insufficient documentation

## 2012-10-01 DIAGNOSIS — I10 Essential (primary) hypertension: Secondary | ICD-10-CM | POA: Insufficient documentation

## 2012-10-01 DIAGNOSIS — Z794 Long term (current) use of insulin: Secondary | ICD-10-CM | POA: Insufficient documentation

## 2012-10-01 DIAGNOSIS — IMO0002 Reserved for concepts with insufficient information to code with codable children: Secondary | ICD-10-CM | POA: Insufficient documentation

## 2012-10-01 DIAGNOSIS — K449 Diaphragmatic hernia without obstruction or gangrene: Secondary | ICD-10-CM | POA: Insufficient documentation

## 2012-10-01 DIAGNOSIS — E119 Type 2 diabetes mellitus without complications: Secondary | ICD-10-CM | POA: Insufficient documentation

## 2012-10-01 HISTORY — DX: Barrett's esophagus without dysplasia: K22.70

## 2012-10-01 HISTORY — DX: Hyperlipidemia, unspecified: E78.5

## 2012-10-01 SURGERY — EGD (ESOPHAGOGASTRODUODENOSCOPY)
Anesthesia: Moderate Sedation

## 2012-10-01 MED ORDER — GLUCAGON HCL (RDNA) 1 MG IJ SOLR
INTRAMUSCULAR | Status: AC
  Filled 2012-10-01: qty 1

## 2012-10-01 MED ORDER — GLUCAGON HCL (RDNA) 1 MG IJ SOLR
0.5000 mg | Freq: Once | INTRAMUSCULAR | Status: AC
  Administered 2012-10-01: 0.5 mg via INTRAVENOUS

## 2012-10-01 MED ORDER — SODIUM CHLORIDE 0.9 % IV SOLN
INTRAVENOUS | Status: DC
  Administered 2012-10-01: 21:00:00 via INTRAVENOUS

## 2012-10-01 MED ORDER — GLUCAGON HCL (RDNA) 1 MG IJ SOLR: 1.0000 mg | Freq: Once | INTRAMUSCULAR | Status: DC

## 2012-10-01 NOTE — ED Notes (Signed)
Patient reports felt as if a piece of chicken got stuck in throat when she was eating earlier today. Reports feels as if something is stuck in throat, has been vomiting, and has had burning in throat ever since. Denies any airway complications or trouble breathing. Reports history of esophageal problems and has had procedure to stretch esophagus before.

## 2012-10-01 NOTE — ED Notes (Signed)
Pt presents with ' piece of chicken caught in throat since 1030 am". Pt reports has not eaten anything since as she can not drink water without vomiting. Pt denies SOB, no respiratory distress noted. EDP at bedside, Pt drank 2 lg swallows of water and preceded to vomit water .  0.5 mg given via IV per verbal order from EDP. Will continue to monitor.

## 2012-10-01 NOTE — ED Provider Notes (Signed)
History     CSN: [id number]  Arrival date & time 10/01/12  2024   First MD Initiated Contact with Patient 10/01/12 2101      Chief Complaint  Patient presents with  . Emesis  . Swallowed Foreign Body    HPI Pt was seen at 2100.  Per pt and her husband, c/o sudden onset and persistence of constant esophageal foreign body that began today approx 1030. Pt states she was eating a piece of chicken meat when she "felt it get stuck" in her mid-chest area. States she has been unable to tol PO since that time.  Endorses when she attempts to take PO she "just throws it back up."  States she "can't even drink water."  States she has a hx of esophageal stricture, with last dilatation 03/2012 by Dr. Juanda Chance.  Denies SOB, no wheezing/stridor, no back pain, no abd pain, no CP/palpitations, no hematemesis.     Past Medical History  Diagnosis Date  . HYPERLIPIDEMIA 12/01/2009  . ESSENTIAL HYPERTENSION 12/16/2008  . ESOPHAGEAL STRICTURE 12/16/2008  . Heartburn 04/08/2008  . DYSPHAGIA 04/08/2008  . Cataract   . Depression   . DIABETES MELLITUS, CONTROLLED 09/01/2009  . GERD (gastroesophageal reflux disease)   . Glaucoma   . Barretts esophagus   . Hyperlipidemia     Past Surgical History  Procedure Laterality Date  . Cholecystectomy    . Abdominal hysterectomy  1984    Family History  Problem Relation Age of Onset  . Stroke Mother   . Cancer Sister     brain, breast    History  Substance Use Topics  . Smoking status: Never Smoker   . Smokeless tobacco: Never Used  . Alcohol Use: No     Review of Systems ROS: Statement: All systems negative except as marked or noted in the HPI; Constitutional: Negative for fever and chills. ; ; Eyes: Negative for eye pain, redness and discharge. ; ; ENMT: Negative for ear pain, hoarseness, nasal congestion, sinus pressure and sore throat. ; ; Cardiovascular: Negative for chest pain, palpitations, diaphoresis, dyspnea and peripheral edema. ; ;  Respiratory: Negative for cough, wheezing and stridor. ; ; Gastrointestinal: +esophageal FB. Negative for nausea, vomiting, diarrhea, abdominal pain, blood in stool, hematemesis, jaundice and rectal bleeding. . ; ; Genitourinary: Negative for dysuria, flank pain and hematuria. ; ; Musculoskeletal: Negative for back pain and neck pain. Negative for swelling and trauma.; ; Skin: Negative for pruritus, rash, abrasions, blisters, bruising and skin lesion.; ; Neuro: Negative for headache, lightheadedness and neck stiffness. Negative for weakness, altered level of consciousness , altered mental status, extremity weakness, paresthesias, involuntary movement, seizure and syncope.      Allergies  Review of patient's allergies indicates no known allergies.  Home Medications   Current Outpatient Rx  Name  Route  Sig  Dispense  Refill  . amLODipine-benazepril (LOTREL) 5-10 MG per capsule   Oral   Take 1 capsule by mouth daily.         . bisoprolol-hydrochlorothiazide (ZIAC) 5-6.25 MG per tablet   Oral   Take 1 tablet by mouth daily.         . brimonidine (ALPHAGAN) 0.15 % ophthalmic solution   Both Eyes   Place 1 drop into both eyes 2 (two) times daily.          . dorzolamide-timolol (COSOPT) 22.3-6.8 MG/ML ophthalmic solution   Both Eyes   Place 1 drop into both eyes 2 (two) times daily.          Marland Kitchen  esomeprazole (NEXIUM) 40 MG capsule   Oral   Take 40 mg by mouth daily.         Marland Kitchen glimepiride (AMARYL) 2 MG tablet   Oral   Take 2 tablets (4 mg total) by mouth daily before breakfast.   180 tablet   3   . metFORMIN (GLUCOPHAGE) 500 MG tablet   Oral   Take 500-1,000 mg by mouth 2 (two) times daily. Two in the morning and one in the evening         . simvastatin (ZOCOR) 20 MG tablet   Oral   Take 20 mg by mouth every evening.           BP 138/71  Pulse 93  Temp(Src) 96.8 F (36 C) (Oral)  Resp 16  Ht 5\' 6"  (1.676 m)  Wt 166 lb (75.297 kg)  BMI 26.81 kg/m2  SpO2  98%  Physical Exam 2105: Physical examination:  Nursing notes reviewed; Vital signs and O2 SAT reviewed;  Constitutional: Well developed, Well nourished, Well hydrated, Uncomfortable appearing; Head:  Normocephalic, atraumatic; Eyes: EOMI, PERRL, No scleral icterus; ENMT: Mouth and pharynx normal, Mucous membranes moist; Neck: Supple, Full range of motion, No lymphadenopathy; Cardiovascular: Regular rate and rhythm, No gallop; Respiratory: Breath sounds clear & equal bilaterally, No rales, rhonchi, wheezes.  Speaking full sentences with ease, Normal respiratory effort/excursion; Chest: Nontender, Movement normal; Abdomen: Soft, Nontender, Nondistended, Normal bowel sounds; Genitourinary: No CVA tenderness; Extremities: Pulses normal, No tenderness, No edema, No calf edema or asymmetry.; Neuro: AA&Ox3, Major CN grossly intact.  Speech clear. No gross focal motor or sensory deficits in extremities.; Skin: Color normal, Warm, Dry.   ED Course  Procedures   2110:  Pt took several sips of water, stated "it got stuck right here" (pointed to her mid-chest), then she regurgitated the water.  Will start IV and call GI MD.   2115:  T/C to GI Dr. Karilyn Cota, case discussed, including:  HPI, pertinent PM/SHx, VS/PE, dx testing, ED course and treatment:  Requests to dose IV glucagon 0.5mg  and he will eval pt after his current case.    MDM  MDM Reviewed: previous chart, nursing note and vitals            Laray Anger, DO 10/02/12 1407

## 2012-10-02 ENCOUNTER — Encounter (HOSPITAL_COMMUNITY): Payer: Self-pay | Admitting: *Deleted

## 2012-10-02 ENCOUNTER — Encounter (HOSPITAL_COMMUNITY)
Admission: EM | Disposition: A | Discharge status: Home / Self Care | Payer: Self-pay | Source: Home / Self Care | Attending: Emergency Medicine

## 2012-10-02 DIAGNOSIS — T18108A Unspecified foreign body in esophagus causing other injury, initial encounter: Secondary | ICD-10-CM

## 2012-10-02 DIAGNOSIS — K222 Esophageal obstruction: Secondary | ICD-10-CM

## 2012-10-02 HISTORY — PX: ESOPHAGOGASTRODUODENOSCOPY: SHX5428

## 2012-10-02 HISTORY — PX: FOREIGN BODY REMOVAL: SHX962

## 2012-10-02 SURGERY — EGD (ESOPHAGOGASTRODUODENOSCOPY)
Anesthesia: Moderate Sedation

## 2012-10-02 MED ORDER — STERILE WATER FOR IRRIGATION IR SOLN
Status: DC | PRN
  Administered 2012-10-02: 01:00:00

## 2012-10-02 MED ORDER — MEPERIDINE HCL 50 MG/ML IJ SOLN
INTRAMUSCULAR | Status: AC
  Filled 2012-10-02: qty 1

## 2012-10-02 MED ORDER — MEPERIDINE HCL 25 MG/ML IJ SOLN
INTRAMUSCULAR | Status: DC | PRN
  Administered 2012-10-02: 25 mg via INTRAVENOUS

## 2012-10-02 MED ORDER — MIDAZOLAM HCL 5 MG/5ML IJ SOLN
INTRAMUSCULAR | Status: AC
  Filled 2012-10-02: qty 10

## 2012-10-02 MED ORDER — MIDAZOLAM HCL 5 MG/5ML IJ SOLN
INTRAMUSCULAR | Status: DC | PRN
  Administered 2012-10-02: 2 mg via INTRAVENOUS
  Administered 2012-10-02: 1 mg via INTRAVENOUS

## 2012-10-02 NOTE — Op Note (Signed)
EGD PROCEDURE REPORT  PATIENT:  Kathleen Schroeder  MR#:  [id number] Birthdate:  01/29/1931, 79 y.o., female Endoscopist:  Dr. Malissa Hippo, MD Referred By:  Dr. Samuel Jester, MD Procedure Date: 10/02/2012  Procedure:   EGD with foreign body removal and esophageal dilation.  Indications:  Patient is 79 year old Caucasian female with chronic GERD and history of esophageal stricture who presents with signs and symptoms of esophageal food impaction.            Informed Consent:  The risks, benefits, alternatives & imponderables which include, but are not limited to, bleeding, infection, perforation, drug reaction and potential missed lesion have been reviewed.  The potential for biopsy, lesion removal, esophageal dilation, etc. have also been discussed.  Questions have been answered.  All parties agreeable.  Please see history & physical in medical record for more information.  Medications:  Demerol 25 mg IV Versed 3 mg IV Cetacaine spray topically for oropharyngeal anesthesia  Description of procedure:  The endoscope was introduced through the mouth and advanced to the second portion of the duodenum without difficulty or limitations. The mucosal surfaces were surveyed very carefully during advancement of the scope and upon withdrawal.  Findings:  Esophagus:  There is round piece of foreign body of meat noted at distal esophagus. It was called with Lucina Mellow net and broken into small pieces and pushed distally. Stricture noted at GE junction. GEJ:  34 cm Hiatus:  37 cm Stomach:  Stomach was empty and distended very well with insufflation. Folds in the proximal stomach were normal. Examination mucosa and body, antrum, pyloric channel, angularis fundus and cardia was normal. Duodenum:  Normal bulbar and post bulbar mucosa.  Therapeutic/Diagnostic Maneuvers Performed:   Esophageal  foreign body was removed as described above. Esophageal stricture was dilated with balloon dilator. A balloon  dilator was passed through the scope. The guidewire portion the gastric lumen and balloon dilator was positioned across the stricture. It was insufflated to 15 mm and subsequently to 16.5 and 18 mm disrupting mucosa at the ring.  Complications:  None  Impression:  Foreign body distal esophagus. It was caught with  Lucina Mellow net broken into pieces and pushed distally. Stricture at GE junction was dilated with balloon to 18 mm. Small sliding hiatal hernia.  Recommendations:  Standard instructions given. Patient advised to chew her food thoroughly before she attempts to swallow. Repeat dilation on an as-needed basis.  Kyel Purk U  10/02/2012  12:52 AM  CC: Dr. Kristian Covey, MD & Dr. Bonnetta Barry ref. provider found

## 2012-10-02 NOTE — ED Notes (Signed)
Patient taken to Endoscopy. Belongings sent with patient's husband.

## 2012-10-02 NOTE — H&P (Signed)
Kathleen Schroeder is an 79 y.o. female.   Chief Complaint: Patient's in for EGD with foreign body removal and possible ED. HPI: Patient is a year-old Caucasian female who has chronic GERD complicated by esophageal stricture we'll presents with dysphagia. She's not been able to swallow solids or liquids since last evening when she was eating chicken. She says the heartburn type of phototherapy. She has had her esophagus first and 20 02/06/2011 and in December 2013. On her last exam esophagus was dilated by Dr. Julio Alm from 13 mm to 16 mm using Savary dilator. She has good appetite. Harpoons well-controlled with therapy.  Past Medical History  Diagnosis Date  . HYPERLIPIDEMIA 12/01/2009  . ESSENTIAL HYPERTENSION 12/16/2008  . ESOPHAGEAL STRICTURE 12/16/2008  . Heartburn 04/08/2008  . DYSPHAGIA 04/08/2008  . Cataract   . Depression   . DIABETES MELLITUS, CONTROLLED 09/01/2009  . GERD (gastroesophageal reflux disease)   . Glaucoma   . Barretts esophagus   . Hyperlipidemia     Past Surgical History  Procedure Laterality Date  . Cholecystectomy    . Abdominal hysterectomy  1984    Family History  Problem Relation Age of Onset  . Stroke Mother   . Cancer Sister     brain, breast   Social History:  reports that she has never smoked. She has never used smokeless tobacco. She reports that she does not drink alcohol or use illicit drugs.  Allergies: No Known Allergies  Medications Prior to Admission  Medication Sig Dispense Refill  . amLODipine-benazepril (LOTREL) 5-10 MG per capsule Take 1 capsule by mouth daily.      . bisoprolol-hydrochlorothiazide (ZIAC) 5-6.25 MG per tablet Take 1 tablet by mouth daily.      . brimonidine (ALPHAGAN) 0.15 % ophthalmic solution Place 1 drop into both eyes 2 (two) times daily.       . dorzolamide-timolol (COSOPT) 22.3-6.8 MG/ML ophthalmic solution Place 1 drop into both eyes 2 (two) times daily.       Marland Kitchen esomeprazole (NEXIUM) 40 MG capsule Take 40 mg by  mouth daily.      Marland Kitchen glimepiride (AMARYL) 2 MG tablet Take 2 tablets (4 mg total) by mouth daily before breakfast.  180 tablet  3  . metFORMIN (GLUCOPHAGE) 500 MG tablet Take 500-1,000 mg by mouth 2 (two) times daily. Two in the morning and one in the evening      . simvastatin (ZOCOR) 20 MG tablet Take 20 mg by mouth every evening.        No results found for this or any previous visit (from the past 48 hour(s)). No results found.  ROS  Blood pressure 138/71, pulse 93, temperature 96.8 F (36 C), temperature source Oral, resp. rate 16, height 5\' 6"  (1.676 m), weight 166 lb (75.297 kg), SpO2 98.00%. Physical Exam  Constitutional: She appears well-developed and well-nourished.  HENT:  Mouth/Throat: Oropharynx is clear and moist.  Eyes: Conjunctivae are normal. No scleral icterus.  Neck: No thyromegaly present.  Cardiovascular: Normal rate, regular rhythm and normal heart sounds.   No murmur heard. Respiratory: Effort normal and breath sounds normal.  GI: Soft. She exhibits no distension and no mass. There is no tenderness.  Musculoskeletal: She exhibits no edema.  Lymphadenopathy:    She has no cervical adenopathy.  Neurological: She is alert.  Skin: Skin is warm and dry.     Assessment/Plan Foreign body esophagus. EGD with foreign body removal and possible ED.  Solon Alban U 10/02/2012, 12:15 AM

## 2012-10-11 ENCOUNTER — Encounter (HOSPITAL_COMMUNITY): Payer: Self-pay | Admitting: Internal Medicine

## 2012-12-30 ENCOUNTER — Other Ambulatory Visit: Payer: Self-pay | Admitting: Family Medicine

## 2013-01-05 ENCOUNTER — Other Ambulatory Visit: Payer: Self-pay | Admitting: Family Medicine

## 2013-01-17 ENCOUNTER — Other Ambulatory Visit: Payer: Self-pay | Admitting: Family Medicine

## 2013-02-17 ENCOUNTER — Other Ambulatory Visit: Payer: Self-pay | Admitting: Family Medicine

## 2013-03-12 ENCOUNTER — Other Ambulatory Visit: Payer: Self-pay | Admitting: Internal Medicine

## 2013-03-12 ENCOUNTER — Other Ambulatory Visit: Payer: Self-pay | Admitting: Family Medicine

## 2013-03-19 ENCOUNTER — Ambulatory Visit (INDEPENDENT_AMBULATORY_CARE_PROVIDER_SITE_OTHER): Payer: Medicare Other | Admitting: Family Medicine

## 2013-03-19 ENCOUNTER — Encounter: Payer: Self-pay | Admitting: Family Medicine

## 2013-03-19 VITALS — BP 130/82 | HR 80 | Temp 97.7°F | Wt 170.0 lb

## 2013-03-19 DIAGNOSIS — L720 Epidermal cyst: Secondary | ICD-10-CM

## 2013-03-19 DIAGNOSIS — E119 Type 2 diabetes mellitus without complications: Secondary | ICD-10-CM

## 2013-03-19 DIAGNOSIS — L723 Sebaceous cyst: Secondary | ICD-10-CM

## 2013-03-19 DIAGNOSIS — Z23 Encounter for immunization: Secondary | ICD-10-CM

## 2013-03-19 DIAGNOSIS — E785 Hyperlipidemia, unspecified: Secondary | ICD-10-CM

## 2013-03-19 LAB — HEMOGLOBIN A1C: Hgb A1c MFr Bld: 7.5 % — ABNORMAL HIGH (ref 4.6–6.5)

## 2013-03-19 NOTE — Progress Notes (Signed)
   Subjective:    Patient ID: Kathleen Schroeder, female    DOB: 10-15-1930, 79 y.o.   MRN: [id number]  HPI Patient here for multiple issues as follows  Cystic lesion left cheek. She requests excision. This has not recently grown. No bleeding. Nonpainful. No personal history of any skin cancer.  She has chronic problems including hyperlipidemia, hypertension, GERD, esophageal stricture, type 2 diabetes. Fasting blood sugars recently been stable. Last A1c 7.4%. She's not had any symptoms of hyperglycemia. Medications reviewed. No recent hypoglycemia.  On Simvastatin for lipids.   No recent myalgias.  No recent chest pain or dizziness.  Compliant with all meds and denies any side effects.  Past Medical History  Diagnosis Date  . HYPERLIPIDEMIA 12/01/2009  . ESSENTIAL HYPERTENSION 12/16/2008  . ESOPHAGEAL STRICTURE 12/16/2008  . Heartburn 04/08/2008  . DYSPHAGIA 04/08/2008  . Cataract   . Depression   . DIABETES MELLITUS, CONTROLLED 09/01/2009  . GERD (gastroesophageal reflux disease)   . Glaucoma   . Barretts esophagus   . Hyperlipidemia    Past Surgical History  Procedure Laterality Date  . Cholecystectomy    . Abdominal hysterectomy  1984  . Esophagogastroduodenoscopy N/A 10/02/2012    Procedure: ESOPHAGOGASTRODUODENOSCOPY (EGD);  Surgeon: Malissa Hippo, MD;  Location: [facility];  Service: Endoscopy;  Laterality: N/A;  . Foreign body removal N/A 10/02/2012    Procedure: FOREIGN BODY REMOVAL;  Surgeon: Malissa Hippo, MD;  Location: [facility];  Service: Endoscopy;  Laterality: N/A;    reports that she has never smoked. She has never used smokeless tobacco. She reports that she does not drink alcohol or use illicit drugs. family history includes Cancer in her sister; Stroke in her mother. No Known Allergies    Review of Systems  Constitutional: Negative for fatigue.  Eyes: Negative for visual disturbance.  Respiratory: Negative for cough, chest tightness, shortness of  breath and wheezing.   Cardiovascular: Negative for chest pain, palpitations and leg swelling.  Endocrine: Negative for polydipsia and polyuria.  Genitourinary: Negative for dysuria.  Neurological: Negative for dizziness, seizures, syncope, weakness, light-headedness and headaches.       Objective:   Physical Exam  Constitutional: She appears well-developed and well-nourished.  Neck: Neck supple. No thyromegaly present.  Cardiovascular: Normal rate.   Pulmonary/Chest: Effort normal and breath sounds normal. No respiratory distress. She has no wheezes. She has no rales.  Musculoskeletal: She exhibits no edema.  Skin:  Patient has small about 4 mm mobile nontender cystic lesion left cheek. No overlying skin changes          Assessment & Plan:  #1 type 2 diabetes. History of fair control. Stable by home readings. Repeat A1c. Continue regular eye exams #2 health maintenance. Patient needs pneumonia vaccine and influenza and we're able to convince her to get those today #3 hyperlipidemia on simvastatin. Lipids are stable- repeat in 6 months #4 hypertension which is stable at goal. Continue current medications #5 benign cystic lesion left cheek. Discussion with patient regarding risks and benefits of excision. She is aware of risk of bleeding, bruising, infection, and scarring.  Pt consents.  Prepped skin with betadine and alcohol. Anesthesia with 1% plain xylocaine.  Making small linear incision with #11 blade, exposed cyst and removed with currette. Minimal bleeding controlled with Ag Nitrate .  Topical antibiotic and steri-strip applied.

## 2013-03-19 NOTE — Progress Notes (Signed)
Pre visit review using our clinic review tool, if applicable. No additional management support is needed unless otherwise documented below in the visit note. 

## 2013-03-19 NOTE — Patient Instructions (Signed)
Keep wound dry for the first 24 hours then clean daily with soap and water for one week. Apply topical antibiotic daily for 3-4 days. Keep covered with clean dressing for 4-5 days. Follow up promptly for any signs of infection such as redness, warmth, pain, or drainage.  

## 2013-04-04 ENCOUNTER — Other Ambulatory Visit: Payer: Self-pay | Admitting: Family Medicine

## 2013-06-12 ENCOUNTER — Other Ambulatory Visit: Payer: Self-pay | Admitting: Family Medicine

## 2013-08-11 ENCOUNTER — Other Ambulatory Visit: Payer: Self-pay | Admitting: Family Medicine

## 2013-09-12 ENCOUNTER — Other Ambulatory Visit: Payer: Self-pay | Admitting: Internal Medicine

## 2013-09-17 ENCOUNTER — Ambulatory Visit: Payer: Medicare Other | Admitting: Family Medicine

## 2013-09-18 ENCOUNTER — Encounter: Payer: Self-pay | Admitting: Family Medicine

## 2013-09-18 ENCOUNTER — Ambulatory Visit (INDEPENDENT_AMBULATORY_CARE_PROVIDER_SITE_OTHER): Payer: Medicare Other | Admitting: Family Medicine

## 2013-09-18 VITALS — BP 130/70 | HR 71 | Temp 97.5°F | Wt 167.0 lb

## 2013-09-18 DIAGNOSIS — R5381 Other malaise: Secondary | ICD-10-CM

## 2013-09-18 DIAGNOSIS — R05 Cough: Secondary | ICD-10-CM

## 2013-09-18 DIAGNOSIS — I10 Essential (primary) hypertension: Secondary | ICD-10-CM

## 2013-09-18 DIAGNOSIS — R059 Cough, unspecified: Secondary | ICD-10-CM

## 2013-09-18 DIAGNOSIS — R5383 Other fatigue: Secondary | ICD-10-CM

## 2013-09-18 DIAGNOSIS — E119 Type 2 diabetes mellitus without complications: Secondary | ICD-10-CM

## 2013-09-18 DIAGNOSIS — E785 Hyperlipidemia, unspecified: Secondary | ICD-10-CM

## 2013-09-18 LAB — CBC WITH DIFFERENTIAL/PLATELET
BASOS PCT: 0.2 % (ref 0.0–3.0)
Basophils Absolute: 0 10*3/uL (ref 0.0–0.1)
EOS PCT: 2.4 % (ref 0.0–5.0)
Eosinophils Absolute: 0.2 10*3/uL (ref 0.0–0.7)
HEMATOCRIT: 39.2 % (ref 36.0–46.0)
HEMOGLOBIN: 12.8 g/dL (ref 12.0–15.0)
LYMPHS ABS: 1.7 10*3/uL (ref 0.7–4.0)
Lymphocytes Relative: 24.8 % (ref 12.0–46.0)
MCHC: 32.7 g/dL (ref 30.0–36.0)
MCV: 87 fl (ref 78.0–100.0)
MONO ABS: 0.5 10*3/uL (ref 0.1–1.0)
MONOS PCT: 6.6 % (ref 3.0–12.0)
NEUTROS ABS: 4.6 10*3/uL (ref 1.4–7.7)
Neutrophils Relative %: 66 % (ref 43.0–77.0)
PLATELETS: 272 10*3/uL (ref 150.0–400.0)
RBC: 4.51 Mil/uL (ref 3.87–5.11)
RDW: 14.2 % (ref 11.5–15.5)
WBC: 7 10*3/uL (ref 4.0–10.5)

## 2013-09-18 LAB — BASIC METABOLIC PANEL
BUN: 11 mg/dL (ref 6–23)
CO2: 28 meq/L (ref 19–32)
Calcium: 9.7 mg/dL (ref 8.4–10.5)
Chloride: 101 mEq/L (ref 96–112)
Creatinine, Ser: 0.8 mg/dL (ref 0.4–1.2)
GFR: 69.15 mL/min (ref >60.00)
GLUCOSE: 127 mg/dL — AB (ref 70–99)
POTASSIUM: 4.3 meq/L (ref 3.5–5.1)
SODIUM: 137 meq/L (ref 135–145)

## 2013-09-18 LAB — HM DIABETES EYE EXAM

## 2013-09-18 LAB — LIPID PANEL
CHOL/HDL RATIO: 3
Cholesterol: 139 mg/dL (ref 0–200)
HDL: 51 mg/dL (ref >39.00)
LDL CALC: 62 mg/dL (ref 0–99)
Triglycerides: 128 mg/dL (ref 0.0–149.0)
VLDL: 25.6 mg/dL (ref 0.0–40.0)

## 2013-09-18 LAB — HEPATIC FUNCTION PANEL
ALT: 37 U/L — ABNORMAL HIGH (ref 0–35)
AST: 44 U/L — ABNORMAL HIGH (ref 0–37)
Albumin: 3.7 g/dL (ref 3.5–5.2)
Alkaline Phosphatase: 56 U/L (ref 39–117)
BILIRUBIN DIRECT: 0 mg/dL (ref 0.0–0.3)
BILIRUBIN TOTAL: 0.6 mg/dL (ref 0.2–1.2)
Total Protein: 7.5 g/dL (ref 6.0–8.3)

## 2013-09-18 LAB — HM DIABETES FOOT EXAM: HM Diabetic Foot Exam: NORMAL

## 2013-09-18 LAB — TSH: TSH: 1.04 u[IU]/mL (ref 0.35–4.50)

## 2013-09-18 MED ORDER — AMLODIPINE BESYLATE 5 MG PO TABS: 5.0000 mg | ORAL_TABLET | Freq: Every day | ORAL | Status: DC

## 2013-09-18 MED ORDER — LOSARTAN POTASSIUM 50 MG PO TABS: 50.0000 mg | ORAL_TABLET | Freq: Every day | ORAL | Status: DC

## 2013-09-18 NOTE — Patient Instructions (Signed)
Stopped amlodipine-benazepril Start amlodipine 5 mg once daily Start losartan 50 mg once daily

## 2013-09-18 NOTE — Progress Notes (Signed)
   Subjective:    Patient ID: Kathleen Schroeder, female    DOB: 1930/11/24, 80 y.o.   MRN: [id number]  HPI  Followup. Her chronic problems include history of hypertension, type 2 diabetes, hyperlipidemia, GERD with prior esophageal stricture. No recent dysphagia. She's had some occasional nonspecific dizziness. She complains of some generalized fatigue. She complains of dry cough and thinks this may be related to her benazepril. She's not any fevers or chills. No dyspnea. No chest pains. No orthostatic symptoms. Compliant with medications.  Type 2 diabetes which has been stable. Recent fasting blood sugars mostly low 100s. Most recent A1c 7.5%.  Past Medical History  Diagnosis Date  . HYPERLIPIDEMIA 12/01/2009  . ESSENTIAL HYPERTENSION 12/16/2008  . ESOPHAGEAL STRICTURE 12/16/2008  . Heartburn 04/08/2008  . DYSPHAGIA 04/08/2008  . Cataract   . Depression   . DIABETES MELLITUS, CONTROLLED 09/01/2009  . GERD (gastroesophageal reflux disease)   . Glaucoma   . Barretts esophagus   . Hyperlipidemia    Past Surgical History  Procedure Laterality Date  . Cholecystectomy    . Abdominal hysterectomy  1984  . Esophagogastroduodenoscopy N/A 10/02/2012    Procedure: ESOPHAGOGASTRODUODENOSCOPY (EGD);  Surgeon: Rogene Houston, MD;  Location: [facility];  Service: Endoscopy;  Laterality: N/A;  . Foreign body removal N/A 10/02/2012    Procedure: FOREIGN BODY REMOVAL;  Surgeon: Rogene Houston, MD;  Location: [facility];  Service: Endoscopy;  Laterality: N/A;    reports that she has never smoked. She has never used smokeless tobacco. She reports that she does not drink alcohol or use illicit drugs. family history includes Cancer in her sister; Stroke in her mother. No Known Allergies    Review of Systems  Constitutional: Positive for fatigue.  Eyes: Negative for visual disturbance.  Respiratory: Positive for cough. Negative for chest tightness, shortness of breath and wheezing.     Cardiovascular: Negative for chest pain, palpitations and leg swelling.  Neurological: Negative for dizziness, seizures, syncope, weakness, light-headedness and headaches.  Hematological: Negative for adenopathy.       Objective:   Physical Exam  Constitutional: She appears well-developed and well-nourished.  HENT:  Mouth/Throat: Oropharynx is clear and moist.  Neck: Neck supple. No thyromegaly present.  Cardiovascular: Normal rate and regular rhythm.   Pulmonary/Chest: Effort normal and breath sounds normal. No respiratory distress. She has no wheezes. She has no rales.  Musculoskeletal: She exhibits no edema.  Lymphadenopathy:    She has no cervical adenopathy.          Assessment & Plan:  #1 hypertension. Stable. Check basic metabolic panel #2 dry cough. Probably related to ACE inhibitor. Discontinue amlodipine and benazepril combination. We'll split out amlodipine to continue 5 mg daily and start losartan 50 mg once daily (in place of the Benazepril) #3 type 2 diabetes. History fair control. Recheck A1c #4 fatigue. Likely multifactorial. Check TSH and CBC. Check electrolytes.

## 2013-09-18 NOTE — Progress Notes (Signed)
Pre visit review using our clinic review tool, if applicable. No additional management support is needed unless otherwise documented below in the visit note. 

## 2013-09-19 ENCOUNTER — Telehealth: Payer: Self-pay | Admitting: Family Medicine

## 2013-09-19 NOTE — Telephone Encounter (Signed)
Relevant patient education mailed to patient.  

## 2013-09-28 ENCOUNTER — Ambulatory Visit (INDEPENDENT_AMBULATORY_CARE_PROVIDER_SITE_OTHER): Payer: Medicare Other | Admitting: Family Medicine

## 2013-09-28 ENCOUNTER — Encounter: Payer: Self-pay | Admitting: Family Medicine

## 2013-09-28 VITALS — BP 132/72 | HR 78 | Temp 97.8°F | Wt 167.0 lb

## 2013-09-28 DIAGNOSIS — T148 Other injury of unspecified body region: Secondary | ICD-10-CM

## 2013-09-28 DIAGNOSIS — W57XXXA Bitten or stung by nonvenomous insect and other nonvenomous arthropods, initial encounter: Secondary | ICD-10-CM

## 2013-09-28 NOTE — Progress Notes (Signed)
Pre visit review using our clinic review tool, if applicable. No additional management support is needed unless otherwise documented below in the visit note. 

## 2013-09-28 NOTE — Progress Notes (Signed)
   Subjective:    Patient ID: Kathleen Schroeder, female    DOB: [birth year], 80 y.o.   MRN: [id number]  HPI Tick bite 3 days ago. Removed left anterior thigh. They think this was a deer tick. She's had some localized redness and swelling and itching since then. Denies any headache. No increased arthralgias. No rashes suspicious for erythema migrans. She's had some generalized fatigue.  Past Medical History  Diagnosis Date  . HYPERLIPIDEMIA 12/01/2009  . ESSENTIAL HYPERTENSION 12/16/2008  . ESOPHAGEAL STRICTURE 12/16/2008  . Heartburn 04/08/2008  . DYSPHAGIA 04/08/2008  . Cataract   . Depression   . DIABETES MELLITUS, CONTROLLED 09/01/2009  . GERD (gastroesophageal reflux disease)   . Glaucoma   . Barretts esophagus   . Hyperlipidemia    Past Surgical History  Procedure Laterality Date  . Cholecystectomy    . Abdominal hysterectomy  1984  . Esophagogastroduodenoscopy N/A 10/02/2012    Procedure: ESOPHAGOGASTRODUODENOSCOPY (EGD);  Surgeon: Rogene Houston, MD;  [facility];  Service: Endoscopy;  Laterality: N/A;  . Foreign body removal N/A 10/02/2012    Procedure: FOREIGN BODY REMOVAL;  Surgeon: Rogene Houston, MD;  Location: [facility];  Service: Endoscopy;  Laterality: N/A;    reports that she has never smoked. She has never used smokeless tobacco. She reports that she does not drink alcohol or use illicit drugs. family history includes Cancer in her sister; Stroke in her mother. No Known Allergies    Review of Systems  Constitutional: Negative for fever and chills.  Respiratory: Negative for shortness of breath.   Skin: Positive for rash.  Neurological: Negative for headaches.  Hematological: Negative for adenopathy.       Objective:   Physical Exam  Constitutional: She appears well-developed and well-nourished.  Cardiovascular: Normal rate and regular rhythm.   Pulmonary/Chest: Effort normal and breath sounds normal. No respiratory distress. She has no  wheezes. She has no rales.  Musculoskeletal: She exhibits no edema.  Skin: Rash noted.  Patient has area of erythema about 2 x 2 centimeters minimally swollen and erythematous. Punctate center. No pustule. No visible retained tick parts.  Non-tender.          Assessment & Plan:  Tick bite left anterior thigh with local allergic reaction. She does not have any rash suggestive of erythema migrans. Followup promptly for any headache, fever, progressive rash, arthralgias

## 2013-09-28 NOTE — Patient Instructions (Signed)
Tick Bite Information Ticks are insects that attach themselves to the skin and draw blood for food. There are various types of ticks. Common types include wood ticks and deer ticks. Most ticks live in shrubs and grassy areas. Ticks can climb onto your body when you make contact with leaves or grass where the tick is waiting. The most common places on the body for ticks to attach themselves are the scalp, neck, armpits, waist, and groin. Most tick bites are harmless, but sometimes ticks carry germs that cause diseases. These germs can be spread to a person during the tick's feeding process. The chance of a disease spreading through a tick bite depends on:   The type of tick.  Time of year.   How long the tick is attached.   Geographic location.  HOW CAN YOU PREVENT TICK BITES? Take these steps to help prevent tick bites when you are outdoors:  Wear protective clothing. Long sleeves and long pants are best.   Wear white clothes so you can see ticks more easily.  Tuck your pant legs into your socks.   If walking on a trail, stay in the middle of the trail to avoid brushing against bushes.  Avoid walking through areas with long grass.  Put insect repellent on all exposed skin and along boot tops, pant legs, and sleeve cuffs.   Check clothing, hair, and skin repeatedly and before going inside.   Brush off any ticks that are not attached.  Take a shower or bath as soon as possible after being outdoors.  WHAT IS THE PROPER WAY TO REMOVE A TICK? Ticks should be removed as soon as possible to help prevent diseases caused by tick bites. 1. If latex gloves are available, put them on before trying to remove a tick.  2. Using fine-point tweezers, grasp the tick as close to the skin as possible. You may also use curved forceps or a tick removal tool. Grasp the tick as close to its head as possible. Avoid grasping the tick on its body. 3. Pull gently with steady upward pressure until  the tick lets go. Do not twist the tick or jerk it suddenly. This may break off the tick's head or mouth parts. 4. Do not squeeze or crush the tick's body. This could force disease-carrying fluids from the tick into your body.  5. After the tick is removed, wash the bite area and your hands with soap and water or other disinfectant such as alcohol. 6. Apply a small amount of antiseptic cream or ointment to the bite site.  7. Wash and disinfect any instruments that were used.  Do not try to remove a tick by applying a hot match, petroleum jelly, or fingernail polish to the tick. These methods do not work and may increase the chances of disease being spread from the tick bite.  WHEN SHOULD YOU SEEK MEDICAL CARE? Contact your health care provider if you are unable to remove a tick from your skin or if a part of the tick breaks off and is stuck in the skin.  After a tick bite, you need to be aware of signs and symptoms that could be related to diseases spread by ticks. Contact your health care provider if you develop any of the following in the days or weeks after the tick bite:  Unexplained fever.  Rash. A circular rash that appears days or weeks after the tick bite may indicate the possibility of Lyme disease. The rash may resemble   a target with a bull's-eye and may occur at a different part of your body than the tick bite.  Redness and swelling in the area of the tick bite.   Tender, swollen lymph glands.   Diarrhea.   Weight loss.   Cough.   Fatigue.   Muscle, joint, or bone pain.   Abdominal pain.   Headache.   Lethargy or a change in your level of consciousness.  Difficulty walking or moving your legs.   Numbness in the legs.   Paralysis.  Shortness of breath.   Confusion.   Repeated vomiting.  Document Released: 04/02/2000 Document Revised: 01/24/2013 Document Reviewed: 09/13/2012 [facility] Patient Information 2014 [place].  Consider  antihistamine such as Claritan or Allegra for allergic reaction.

## 2013-10-06 ENCOUNTER — Other Ambulatory Visit: Payer: Self-pay | Admitting: Family Medicine

## 2013-12-15 ENCOUNTER — Other Ambulatory Visit: Payer: Self-pay | Admitting: Family Medicine

## 2013-12-18 ENCOUNTER — Ambulatory Visit: Payer: Medicare Other | Admitting: Family Medicine

## 2014-01-01 ENCOUNTER — Other Ambulatory Visit: Payer: Self-pay | Admitting: Family Medicine

## 2014-04-02 ENCOUNTER — Other Ambulatory Visit: Payer: Self-pay | Admitting: Family Medicine

## 2014-05-19 ENCOUNTER — Emergency Department (HOSPITAL_COMMUNITY)
Admission: EM | Admit: 2014-05-19 | Discharge: 2014-05-19 | Disposition: A | Discharge status: Home / Self Care | Payer: Medicare Other | Attending: Emergency Medicine | Admitting: Emergency Medicine

## 2014-05-19 ENCOUNTER — Encounter (HOSPITAL_COMMUNITY): Payer: Self-pay | Admitting: Emergency Medicine

## 2014-05-19 ENCOUNTER — Emergency Department (HOSPITAL_COMMUNITY): Payer: Medicare Other

## 2014-05-19 DIAGNOSIS — R05 Cough: Secondary | ICD-10-CM

## 2014-05-19 DIAGNOSIS — E119 Type 2 diabetes mellitus without complications: Secondary | ICD-10-CM | POA: Diagnosis not present

## 2014-05-19 DIAGNOSIS — I10 Essential (primary) hypertension: Secondary | ICD-10-CM | POA: Diagnosis not present

## 2014-05-19 DIAGNOSIS — Z79899 Other long term (current) drug therapy: Secondary | ICD-10-CM | POA: Diagnosis not present

## 2014-05-19 DIAGNOSIS — Z8669 Personal history of other diseases of the nervous system and sense organs: Secondary | ICD-10-CM | POA: Insufficient documentation

## 2014-05-19 DIAGNOSIS — K5900 Constipation, unspecified: Secondary | ICD-10-CM | POA: Diagnosis not present

## 2014-05-19 DIAGNOSIS — E785 Hyperlipidemia, unspecified: Secondary | ICD-10-CM | POA: Insufficient documentation

## 2014-05-19 DIAGNOSIS — J40 Bronchitis, not specified as acute or chronic: Secondary | ICD-10-CM

## 2014-05-19 DIAGNOSIS — J209 Acute bronchitis, unspecified: Secondary | ICD-10-CM | POA: Insufficient documentation

## 2014-05-19 DIAGNOSIS — F329 Major depressive disorder, single episode, unspecified: Secondary | ICD-10-CM | POA: Diagnosis not present

## 2014-05-19 DIAGNOSIS — Z792 Long term (current) use of antibiotics: Secondary | ICD-10-CM | POA: Insufficient documentation

## 2014-05-19 DIAGNOSIS — R059 Cough, unspecified: Secondary | ICD-10-CM

## 2014-05-19 LAB — CBC WITH DIFFERENTIAL/PLATELET
BASOS PCT: 0 % (ref 0–1)
Basophils Absolute: 0 10*3/uL (ref 0.0–0.1)
Eosinophils Absolute: 0.2 10*3/uL (ref 0.0–0.7)
Eosinophils Relative: 3 % (ref 0–5)
HCT: 35.3 % — ABNORMAL LOW (ref 36.0–46.0)
Hemoglobin: 11.3 g/dL — ABNORMAL LOW (ref 12.0–15.0)
LYMPHS PCT: 24 % (ref 12–46)
Lymphs Abs: 2 10*3/uL (ref 0.7–4.0)
MCH: 28 pg (ref 26.0–34.0)
MCHC: 32 g/dL (ref 30.0–36.0)
MCV: 87.4 fL (ref 78.0–100.0)
MONO ABS: 0.6 10*3/uL (ref 0.1–1.0)
MONOS PCT: 7 % (ref 3–12)
NEUTROS ABS: 5.6 10*3/uL (ref 1.7–7.7)
NEUTROS PCT: 67 % (ref 43–77)
Platelets: 260 10*3/uL (ref 150–400)
RBC: 4.04 MIL/uL (ref 3.87–5.11)
RDW: 14.3 % (ref 11.5–15.5)
WBC: 8.3 10*3/uL (ref 4.0–10.5)

## 2014-05-19 LAB — BASIC METABOLIC PANEL
ANION GAP: 6 (ref 5–15)
BUN: 12 mg/dL (ref 6–23)
CALCIUM: 8.9 mg/dL (ref 8.4–10.5)
CO2: 27 mmol/L (ref 19–32)
Chloride: 101 mmol/L (ref 96–112)
Creatinine, Ser: 0.74 mg/dL (ref 0.50–1.10)
GFR calc Af Amer: >90 mL/min (ref >90)
GFR, EST NON AFRICAN AMERICAN: 78 mL/min — AB (ref >90)
Glucose, Bld: 132 mg/dL — ABNORMAL HIGH (ref 70–99)
Potassium: 3.9 mmol/L (ref 3.5–5.1)
Sodium: 134 mmol/L — ABNORMAL LOW (ref 135–145)

## 2014-05-19 LAB — CBG MONITORING, ED: GLUCOSE-CAPILLARY: 180 mg/dL — AB (ref 70–99)

## 2014-05-19 LAB — TROPONIN I: Troponin I: <0.03 ng/mL (ref <0.031)

## 2014-05-19 MED ORDER — ALBUTEROL SULFATE HFA 108 (90 BASE) MCG/ACT IN AERS: 1.0000 | INHALATION_SPRAY | Freq: Four times a day (QID) | RESPIRATORY_TRACT | Status: AC | PRN

## 2014-05-19 MED ORDER — AZITHROMYCIN 250 MG PO TABS: ORAL_TABLET | ORAL | Status: DC

## 2014-05-19 NOTE — ED Notes (Signed)
EDP at bedside  

## 2014-05-19 NOTE — Discharge Instructions (Signed)
Chest x-ray showed no obvious pneumonia.  Rx for antibiotic and inhaler.  Follow up your dr

## 2014-05-19 NOTE — ED Provider Notes (Signed)
CSN: [id number]     Arrival date & time 05/19/14  8413 History  This chart was scribed for Nat Christen, MD by Stephania Fragmin, ED Scribe. This patient was seen in room [facility] and the patient's care was started at 9:57 AM.    Chief Complaint  Patient presents with  . Cough   The history is provided by the patient. No language interpreter was used.    HPI Comments: Kathleen Schroeder is a 81 y.o. female who presents to the Emergency Department complaining of chest congestion that began 3 weeks ago. She complains of occasional wheezing, dry cough, and upper abdominal pain. Patient reports she hasn't been eating well and has had reduced BMs. Dr. Elease Hashimoto in [place] is her PCP. No chest pain, fever, chills. Taking fluids well. Ambulatory.    Past Medical History  Diagnosis Date  . HYPERLIPIDEMIA 12/01/2009  . ESSENTIAL HYPERTENSION 12/16/2008  . ESOPHAGEAL STRICTURE 12/16/2008  . Heartburn 04/08/2008  . DYSPHAGIA 04/08/2008  . Cataract   . Depression   . DIABETES MELLITUS, CONTROLLED 09/01/2009  . GERD (gastroesophageal reflux disease)   . Glaucoma   . Barretts esophagus   . Hyperlipidemia    Past Surgical History  Procedure Laterality Date  . Cholecystectomy    . Abdominal hysterectomy  1984  . Esophagogastroduodenoscopy N/A 10/02/2012    Procedure: ESOPHAGOGASTRODUODENOSCOPY (EGD);  Surgeon: Rogene Houston, MD;  [facility];  Service: Endoscopy;  Laterality: N/A;  . Foreign body removal N/A 10/02/2012    Procedure: FOREIGN BODY REMOVAL;  Surgeon: Rogene Houston, MD;  [facility];  Service: Endoscopy;  Laterality: N/A;   Family History  Problem Relation Age of Onset  . Stroke Mother   . Cancer Sister     brain, breast   History  Substance Use Topics  . Smoking status: Never Smoker   . Smokeless tobacco: Never Used  . Alcohol Use: No   OB History    No data available     Review of Systems  Respiratory: Positive for cough, shortness of breath and  wheezing.   Gastrointestinal: Positive for abdominal pain and constipation.      Allergies  Review of patient's allergies indicates no known allergies.  Home Medications   Prior to Admission medications   Medication Sig Start Date End Date Taking? Authorizing Provider  amLODipine-benazepril (LOTREL) 5-10 MG per capsule Take 1 capsule by mouth daily.   Yes Historical Provider, MD  bisoprolol-hydrochlorothiazide (ZIAC) 5-6.25 MG per tablet TAKE 1 TABLET EVERY MORNING FOR BLOOD PRESSURE 01/01/14  Yes Eulas Post, MD  brimonidine (ALPHAGAN) 0.15 % ophthalmic solution Place 1 drop into both eyes 2 (two) times daily.  09/28/11  Yes Historical Provider, MD  dorzolamide-timolol (COSOPT) 22.3-6.8 MG/ML ophthalmic solution Place 1 drop into both eyes 2 (two) times daily.  09/28/11  Yes Historical Provider, MD  glimepiride (AMARYL) 2 MG tablet TAKE 2 TABLET IN THE MORNING WITH BREAKFAST 04/02/14  Yes Eulas Post, MD  metFORMIN (GLUCOPHAGE) 500 MG tablet TAKE (2) TABLETS TWICE DAILY (MORNING AND EVEN- ING).   Yes Eulas Post, MD  NEXIUM 40 MG capsule TAKE 1 CAPSULE IN THE MORNING BEFORE BREAKFAST 09/12/13  Yes Ricard Dillon, MD  simvastatin (ZOCOR) 20 MG tablet TAKE 1 TABLET DAILY   Yes Eulas Post, MD  ACCU-CHEK AVIVA PLUS test strip USE AS DIRECTED Patient not taking: Reported on 05/19/2014 12/17/13   Eulas Post, MD  albuterol (PROVENTIL HFA;VENTOLIN HFA) 108 (  90 BASE) MCG/ACT inhaler Inhale 1-2 puffs into the lungs every 6 (six) hours as needed for wheezing or shortness of breath. 05/19/14   Nat Christen, MD  amLODipine (NORVASC) 5 MG tablet Take 1 tablet (5 mg total) by mouth daily. Patient not taking: Reported on 05/19/2014 09/18/13   Eulas Post, MD  azithromycin (ZITHROMAX Z-PAK) 250 MG tablet 2 po day one, then 1 daily x 4 days 05/19/14   Nat Christen, MD  Lancets (FREESTYLE) lancets USE AS DIRECTED Patient not taking: Reported on 05/19/2014    Eulas Post, MD   losartan (COZAAR) 50 MG tablet Take 1 tablet (50 mg total) by mouth daily. Patient not taking: Reported on 05/19/2014 09/18/13   Eulas Post, MD   BP 129/65 mmHg  Pulse 78  Temp(Src) 98 F (36.7 C) (Oral)  Resp 18  Wt 167 lb (75.751 kg)  SpO2 100% Physical Exam  Constitutional: She is oriented to person, place, and time. She appears well-developed and well-nourished.  HENT:  Head: Normocephalic and atraumatic.  Eyes: Conjunctivae and EOM are normal. Pupils are equal, round, and reactive to light.  Neck: Normal range of motion. Neck supple.  Cardiovascular: Normal rate and regular rhythm.   Pulmonary/Chest: Effort normal. She has rales.  Bilateral rales.  Abdominal: Soft. Bowel sounds are normal.  Musculoskeletal: Normal range of motion.  Neurological: She is alert and oriented to person, place, and time.  Skin: Skin is warm and dry.  Psychiatric: She has a normal mood and affect. Her behavior is normal.  Nursing note and vitals reviewed.   ED Course  Procedures (including critical care time)  DIAGNOSTIC STUDIES: Oxygen Saturation is 97% on room air, normal by my interpretation.    COORDINATION OF CARE: 10:00 AM - Discussed treatment plan with pt at bedside which includes chest XR and EKG, and pt agreed to plan. Possibility of CHF.   Labs Review Labs Reviewed  BASIC METABOLIC PANEL - Abnormal; Notable for the following:    Sodium 134 (*)    Glucose, Bld 132 (*)    GFR calc non Af Amer 78 (*)    All other components within normal limits  CBC WITH DIFFERENTIAL/PLATELET - Abnormal; Notable for the following:    Hemoglobin 11.3 (*)    HCT 35.3 (*)    All other components within normal limits  CBG MONITORING, ED - Abnormal; Notable for the following:    Glucose-Capillary 180 (*)    All other components within normal limits  TROPONIN I    Imaging Review Dg Chest 2 View  05/19/2014   CLINICAL DATA:  81 year old female with 2 week history of cough and intermittent  fever.  EXAM: CHEST  2 VIEW  COMPARISON:  Chest x-ray 09/23/2010.  FINDINGS: Diffuse peribronchial cuffing. Lung volumes are normal. No consolidative airspace disease. No pleural effusions. No evidence of pulmonary edema. Heart size is mildly enlarged. The patient is rotated to the right on today's exam, resulting in distortion of the mediastinal contours and reduced diagnostic sensitivity and specificity for mediastinal pathology. Atherosclerosis in the thoracic aorta.  IMPRESSION: 1. Mild diffuse peribronchial cuffing suggestive of acute bronchitis. 2. Mild cardiomegaly. 3. Atherosclerosis.   Electronically Signed   By: Vinnie Langton M.D.   On: 05/19/2014 12:24     EKG Interpretation   Date/Time:  Sunday May 19 2014 10:16:29 EST Ventricular Rate:  69 PR Interval:  157 QRS Duration: 148 QT Interval:  413 QTC Calculation: 442 R Axis:   37  Text Interpretation:  Sinus rhythm Right bundle branch block Baseline  wander in lead(s) V5 Confirmed by Aiyonna Lucado  MD, Galen Malkowski ([id number]) on 05/19/2014  10:20:45 AM      MDM   Final diagnoses:  Cough  Bronchitis   Chest x-ray shows no pneumonia, but suggestive of acute bronchitis. Patient is immunocompromised. Will start Zithromax, albuterol inhaler. She has primary care follow-up.  I personally performed the services described in this documentation, which was scribed in my presence. The recorded information has been reviewed and is accurate.      Nat Christen, MD 05/21/14 1019

## 2014-05-19 NOTE — ED Notes (Signed)
Pt states she was sick about 2 weeks ago and continues to have a cough and feel congested in her chest pt also report low grade intermittent fevers.

## 2014-05-23 ENCOUNTER — Ambulatory Visit (INDEPENDENT_AMBULATORY_CARE_PROVIDER_SITE_OTHER): Payer: Medicare Other | Admitting: Family Medicine

## 2014-05-23 ENCOUNTER — Encounter: Payer: Self-pay | Admitting: Family Medicine

## 2014-05-23 VITALS — BP 132/70 | Temp 98.3°F | Wt 155.0 lb

## 2014-05-23 DIAGNOSIS — E119 Type 2 diabetes mellitus without complications: Secondary | ICD-10-CM

## 2014-05-23 DIAGNOSIS — R630 Anorexia: Secondary | ICD-10-CM

## 2014-05-23 DIAGNOSIS — R634 Abnormal weight loss: Secondary | ICD-10-CM

## 2014-05-23 DIAGNOSIS — D649 Anemia, unspecified: Secondary | ICD-10-CM

## 2014-05-23 DIAGNOSIS — R1011 Right upper quadrant pain: Secondary | ICD-10-CM

## 2014-05-23 LAB — CBC WITH DIFFERENTIAL/PLATELET
BASOS ABS: 0 10*3/uL (ref 0.0–0.1)
Basophils Relative: 0.2 % (ref 0.0–3.0)
EOS ABS: 0.2 10*3/uL (ref 0.0–0.7)
EOS PCT: 2.6 % (ref 0.0–5.0)
HEMATOCRIT: 36.2 % (ref 36.0–46.0)
Hemoglobin: 12.1 g/dL (ref 12.0–15.0)
Lymphocytes Relative: 22.6 % (ref 12.0–46.0)
Lymphs Abs: 1.9 10*3/uL (ref 0.7–4.0)
MCHC: 33.3 g/dL (ref 30.0–36.0)
MCV: 84 fl (ref 78.0–100.0)
MONO ABS: 0.8 10*3/uL (ref 0.1–1.0)
Monocytes Relative: 9.3 % (ref 3.0–12.0)
NEUTROS ABS: 5.6 10*3/uL (ref 1.4–7.7)
Neutrophils Relative %: 65.3 % (ref 43.0–77.0)
Platelets: 305 10*3/uL (ref 150.0–400.0)
RBC: 4.31 Mil/uL (ref 3.87–5.11)
RDW: 14.8 % (ref 11.5–15.5)
WBC: 8.6 10*3/uL (ref 4.0–10.5)

## 2014-05-23 LAB — HEPATIC FUNCTION PANEL
ALK PHOS: 74 U/L (ref 39–117)
ALT: 29 U/L (ref 0–35)
AST: 39 U/L — ABNORMAL HIGH (ref 0–37)
Albumin: 3.3 g/dL — ABNORMAL LOW (ref 3.5–5.2)
Bilirubin, Direct: 0.2 mg/dL (ref 0.0–0.3)
TOTAL PROTEIN: 7.6 g/dL (ref 6.0–8.3)
Total Bilirubin: 0.5 mg/dL (ref 0.2–1.2)

## 2014-05-23 LAB — VITAMIN B12: Vitamin B-12: 230 pg/mL (ref 211–911)

## 2014-05-23 LAB — HEMOGLOBIN A1C: HEMOGLOBIN A1C: 7.4 % — AB (ref 4.6–6.5)

## 2014-05-23 LAB — TSH: TSH: 1.6 u[IU]/mL (ref 0.35–4.50)

## 2014-05-23 LAB — SEDIMENTATION RATE: Sed Rate: 105 mm/hr — ABNORMAL HIGH (ref 0–22)

## 2014-05-23 NOTE — Progress Notes (Signed)
Pre visit review using our clinic review tool, if applicable. No additional management support is needed unless otherwise documented below in the visit note. 

## 2014-05-23 NOTE — Progress Notes (Signed)
Subjective:    Patient ID: Kathleen Schroeder, female    DOB: [birth year], 81 y.o.   MRN: [id number]  HPI Patient seen for ER follow-up. She recently developed some chest congestion about 3 weeks ago and had some possible wheezing off and on along with dry cough and upper abdominal pain. She states that during the same time she's had greatly diminished appetite and some right upper quadrant abdominal pain. She's had previous cholecystectomy. She went to emergency department. Labs significant for hemoglobin 11.3. Negative troponins. Chemistries were unremarkable. No hepatic function done. Chest x-ray showed possible acute bronchitis changes otherwise unremarkable. No infiltrate. Patient was treated with Zithromax. She does not feeling better this time. Still has dry cough. No hemoptysis. Nonsmoker.  Her abdominal pain is relatively mild right upper quadrant without radiation. Denies a nausea or vomiting. No stool changes. No melena. She has some occasional constipation which is chronic. Her weight is down 12 pounds from last visit here last June. She does have type 2 diabetes and blood sugars consistently low 100s. No symptoms of polyuria or polydipsia. She's had previous cholecystectomy and partial hysterectomy.  Does have history of esophageal stricture with EGD December 2013. Denies any recent dysphagia or pain with swallowing.  Past Medical History  Diagnosis Date  . HYPERLIPIDEMIA 12/01/2009  . ESSENTIAL HYPERTENSION 12/16/2008  . ESOPHAGEAL STRICTURE 12/16/2008  . Heartburn 04/08/2008  . DYSPHAGIA 04/08/2008  . Cataract   . Depression   . DIABETES MELLITUS, CONTROLLED 09/01/2009  . GERD (gastroesophageal reflux disease)   . Glaucoma   . Barretts esophagus   . Hyperlipidemia    Past Surgical History  Procedure Laterality Date  . Cholecystectomy    . Abdominal hysterectomy  1984  . Esophagogastroduodenoscopy N/A 10/02/2012    Procedure: ESOPHAGOGASTRODUODENOSCOPY (EGD);  Surgeon: Rogene Houston, MD;  [facility];  Service: Endoscopy;  Laterality: N/A;  . Foreign body removal N/A 10/02/2012    Procedure: FOREIGN BODY REMOVAL;  Surgeon: Rogene Houston, MD;  [facility];  Service: Endoscopy;  Laterality: N/A;    reports that she has never smoked. She has never used smokeless tobacco. She reports that she does not drink alcohol or use illicit drugs. family history includes Cancer in her sister; Stroke in her mother. No Known Allergies    Review of Systems  Constitutional: Positive for appetite change, fatigue and unexpected weight change. Negative for fever and chills.  HENT: Negative for trouble swallowing.   Respiratory: Positive for cough. Negative for shortness of breath and wheezing.   Cardiovascular: Negative for chest pain.  Gastrointestinal: Positive for abdominal pain and constipation. Negative for nausea, vomiting, diarrhea, blood in stool and abdominal distention.  Endocrine: Negative for polydipsia and polyuria.  Genitourinary: Negative for dysuria.  Skin: Negative for rash.  Neurological: Negative for dizziness and headaches.  Psychiatric/Behavioral: Negative for confusion.       Objective:   Physical Exam  Constitutional: She is oriented to person, place, and time. She appears well-developed and well-nourished.  HENT:  Mouth/Throat: Oropharynx is clear and moist.  Eyes: No scleral icterus.  Neck: Neck supple.  Cardiovascular: Normal rate and regular rhythm.   Pulmonary/Chest: Effort normal and breath sounds normal. She has no wheezes. She has no rales.  Abdominal: Soft. Bowel sounds are normal. She exhibits no distension and no mass. There is tenderness. There is no rebound and no guarding.  She has some tenderness confined to the right upper quadrant. No guarding or rebound.  Musculoskeletal: She exhibits no edema.  Lymphadenopathy:    She has no cervical adenopathy.  Neurological: She is alert and oriented to person, place,  and time.  Skin:  Skin does have question of very faint yellow hue.  Cannot be sure if this is early jaundice. No obvious icterus.  Psychiatric: She has a normal mood and affect. Her behavior is normal.          Assessment & Plan:  Patient seen with three-week history of progressive anorexia, weight loss, right upper quadrant abdominal pain. She has history of type 2 diabetes which has been well controlled. Recent ER evaluation with unremarkable chest x-ray. Mild normocytic anemia with hemoglobin 11.3. Combination of anorexia ,weight loss, abdominal pain is concerning. Set up further labs with hepatic panel, repeat CBC, serum iron and TIBC, TSH, A1c. Schedule CT abdomen and pelvis.  Consider referral back to GI if all the above normal

## 2014-05-23 NOTE — Patient Instructions (Signed)
We will call you with CT scan and lab work.

## 2014-05-28 ENCOUNTER — Ambulatory Visit (INDEPENDENT_AMBULATORY_CARE_PROVIDER_SITE_OTHER)
Admission: RE | Admit: 2014-05-28 | Discharge: 2014-05-28 | Disposition: A | Discharge status: Home / Self Care | Payer: Medicare Other | Source: Ambulatory Visit | Attending: Family Medicine | Admitting: Family Medicine

## 2014-05-28 DIAGNOSIS — R630 Anorexia: Secondary | ICD-10-CM

## 2014-05-28 DIAGNOSIS — R1011 Right upper quadrant pain: Secondary | ICD-10-CM

## 2014-05-28 DIAGNOSIS — R634 Abnormal weight loss: Secondary | ICD-10-CM

## 2014-05-28 MED ORDER — IOHEXOL 300 MG/ML  SOLN
100.0000 mL | Freq: Once | INTRAMUSCULAR | Status: AC | PRN
  Administered 2014-05-28: 100 mL via INTRAVENOUS

## 2014-05-29 ENCOUNTER — Telehealth: Payer: Self-pay

## 2014-05-29 NOTE — Telephone Encounter (Signed)
Pt can only come in on mon or tues afternoon. Pt asked me to cb later today and she is going to see if she can get someone to bring her this week

## 2014-05-29 NOTE — Telephone Encounter (Signed)
Can you please call this patient and schedule appointment. Results.

## 2014-05-30 ENCOUNTER — Encounter: Payer: Self-pay | Admitting: Family Medicine

## 2014-05-30 ENCOUNTER — Ambulatory Visit (INDEPENDENT_AMBULATORY_CARE_PROVIDER_SITE_OTHER): Payer: Medicare Other | Admitting: Family Medicine

## 2014-05-30 VITALS — BP 120/68 | HR 72 | Temp 97.8°F | Wt 155.0 lb

## 2014-05-30 DIAGNOSIS — N2889 Other specified disorders of kidney and ureter: Secondary | ICD-10-CM

## 2014-05-30 DIAGNOSIS — R918 Other nonspecific abnormal finding of lung field: Secondary | ICD-10-CM | POA: Insufficient documentation

## 2014-05-30 NOTE — Progress Notes (Signed)
Pre visit review using our clinic review tool, if applicable. No additional management support is needed unless otherwise documented below in the visit note. 

## 2014-05-30 NOTE — Telephone Encounter (Signed)
Pt called back and will come thurs

## 2014-05-30 NOTE — Patient Instructions (Signed)
We will call you with urology referral and CT scan of chest.

## 2014-05-30 NOTE — Progress Notes (Signed)
   Subjective:    Patient ID: Kathleen Schroeder, female    DOB: Aug 17, 1930, 81 y.o.   MRN: [id number]  HPI Patient seen from follow-up from last week. She had recent visit to ER for some chest congestion and was prescribed antibiotic. She has had some nonspecific symptoms of malaise, decreased appetite, weight loss and somewhat poorly localized upper abdominal pain but mostly right upper quadrant. We obtain several labs. Her CBC was normal. Albumin 3.3. AST minimally elevated at 39. TSH normal. Sedimentation rate 105. We set up CT abdomen and pelvis which revealed the following:  IMPRESSION: 1. Large suspicious enhancing heterogeneous and partially calcified mass arising off the lower pole of the right kidney, with indistinct boundaries suggesting infiltration of right renal parenchyma to the level of the midpole. The appearance is concerning for renal cell carcinoma. 2. Small pulmonary nodules at the lung bases concerning for the possibility of renal cell cancer metastasis. These results will be called to the ordering clinician or representative by the Radiologist Assistant, and communication documented in the PACS or zVision Dashboard.  She come back today to discuss results. Denies any new symptoms. No gross hematuria. No fevers or chills. No nausea or vomiting.  Past Medical History  Diagnosis Date  . HYPERLIPIDEMIA 12/01/2009  . ESSENTIAL HYPERTENSION 12/16/2008  . ESOPHAGEAL STRICTURE 12/16/2008  . Heartburn 04/08/2008  . DYSPHAGIA 04/08/2008  . Cataract   . Depression   . DIABETES MELLITUS, CONTROLLED 09/01/2009  . GERD (gastroesophageal reflux disease)   . Glaucoma   . Barretts esophagus   . Hyperlipidemia    Past Surgical History  Procedure Laterality Date  . Cholecystectomy    . Abdominal hysterectomy  1984  . Esophagogastroduodenoscopy N/A 10/02/2012    Procedure: ESOPHAGOGASTRODUODENOSCOPY (EGD);  Surgeon: Rogene Houston, MD;  [facility];  Service:  Endoscopy;  Laterality: N/A;  . Foreign body removal N/A 10/02/2012    Procedure: FOREIGN BODY REMOVAL;  Surgeon: Rogene Houston, MD;  [facility];  Service: Endoscopy;  Laterality: N/A;    reports that she has never smoked. She has never used smokeless tobacco. She reports that she does not drink alcohol or use illicit drugs. family history includes Cancer in her sister; Stroke in her mother. No Known Allergies     Review of Systems  Constitutional: Positive for appetite change, fatigue and unexpected weight change.  Respiratory: Positive for cough. Negative for shortness of breath and wheezing.   Cardiovascular: Negative for chest pain.  Gastrointestinal: Positive for abdominal pain. Negative for nausea, vomiting and blood in stool.  Genitourinary: Negative for dysuria and hematuria.       Objective:   Physical Exam  Constitutional: She appears well-developed and well-nourished.  Neck: Neck supple. No thyromegaly present.  Cardiovascular: Normal rate and regular rhythm.   Pulmonary/Chest: Effort normal and breath sounds normal. No respiratory distress. She has no wheezes. She has no rales.  Abdominal: Soft. There is no tenderness.  Musculoskeletal: She exhibits no edema.  Lymphadenopathy:    She has no cervical adenopathy.  Neurological: She is alert.          Assessment & Plan:  #1 right kidney mass worrisome for malignancy -particularly in the setting of weight loss ,anorexia, poorly localized pain We reviewed results today with patient and have informed her of our concern for possible malignancy. We've recommended prompt follow-up with urology. #2 pulmonary nodules noted on recent CT abdomen and pelvis. Obtain CT lung for further clarification.

## 2014-06-03 ENCOUNTER — Ambulatory Visit: Payer: Medicare Other | Admitting: Family Medicine

## 2014-06-04 ENCOUNTER — Other Ambulatory Visit: Payer: Self-pay | Admitting: Family Medicine

## 2014-06-04 DIAGNOSIS — N2889 Other specified disorders of kidney and ureter: Secondary | ICD-10-CM

## 2014-06-06 ENCOUNTER — Ambulatory Visit (INDEPENDENT_AMBULATORY_CARE_PROVIDER_SITE_OTHER)
Admission: RE | Admit: 2014-06-06 | Discharge: 2014-06-06 | Disposition: A | Discharge status: Home / Self Care | Payer: Medicare Other | Source: Ambulatory Visit | Attending: Family Medicine | Admitting: Family Medicine

## 2014-06-06 DIAGNOSIS — R918 Other nonspecific abnormal finding of lung field: Secondary | ICD-10-CM

## 2014-06-06 DIAGNOSIS — N2889 Other specified disorders of kidney and ureter: Secondary | ICD-10-CM

## 2014-06-06 MED ORDER — IOHEXOL 300 MG/ML  SOLN
100.0000 mL | Freq: Once | INTRAMUSCULAR | Status: AC | PRN
  Administered 2014-06-06: 100 mL via INTRAVENOUS

## 2014-06-07 ENCOUNTER — Telehealth: Payer: Self-pay | Admitting: Family Medicine

## 2014-06-07 NOTE — Telephone Encounter (Signed)
Patient would like to know the results to her CT Scan done on 06/06/14.

## 2014-06-09 NOTE — Telephone Encounter (Signed)
Pt was notified of results of recent CT.  She has pending appt with nephrology.

## 2014-06-27 ENCOUNTER — Other Ambulatory Visit (HOSPITAL_COMMUNITY): Payer: Self-pay | Admitting: Urology

## 2014-06-27 DIAGNOSIS — D49519 Neoplasm of unspecified behavior of unspecified kidney: Secondary | ICD-10-CM

## 2014-07-02 ENCOUNTER — Other Ambulatory Visit: Payer: Self-pay | Admitting: Family Medicine

## 2014-07-08 ENCOUNTER — Ambulatory Visit (HOSPITAL_COMMUNITY)
Admission: RE | Admit: 2014-07-08 | Discharge: 2014-07-08 | Disposition: A | Discharge status: Home / Self Care | Payer: Medicare Other | Source: Ambulatory Visit | Attending: Urology | Admitting: Urology

## 2014-07-08 DIAGNOSIS — N2889 Other specified disorders of kidney and ureter: Secondary | ICD-10-CM | POA: Diagnosis present

## 2014-07-08 DIAGNOSIS — R918 Other nonspecific abnormal finding of lung field: Secondary | ICD-10-CM | POA: Diagnosis not present

## 2014-07-08 DIAGNOSIS — D49519 Neoplasm of unspecified behavior of unspecified kidney: Secondary | ICD-10-CM

## 2014-07-08 DIAGNOSIS — N281 Cyst of kidney, acquired: Secondary | ICD-10-CM | POA: Insufficient documentation

## 2014-07-08 LAB — POCT I-STAT CREATININE: Creatinine, Ser: 0.7 mg/dL (ref 0.50–1.10)

## 2014-07-08 MED ORDER — GADOBENATE DIMEGLUMINE 529 MG/ML IV SOLN
15.0000 mL | Freq: Once | INTRAVENOUS | Status: AC | PRN
Start: 2014-07-08 — End: 2014-07-08
  Administered 2014-07-08: 14 mL via INTRAVENOUS

## 2014-07-09 ENCOUNTER — Telehealth: Payer: Self-pay | Admitting: Family Medicine

## 2014-07-09 ENCOUNTER — Other Ambulatory Visit: Payer: Self-pay | Admitting: Family Medicine

## 2014-07-09 DIAGNOSIS — R262 Difficulty in walking, not elsewhere classified: Secondary | ICD-10-CM

## 2014-07-09 NOTE — Telephone Encounter (Signed)
Yes

## 2014-07-09 NOTE — Telephone Encounter (Signed)
The rolling walker is not handled by pt's local pharm. It will need to go to [place] Fax [phone] [facility] will need pt demographics, insurance info, and diagnosis code  Pt has fallen several times in the past couple of weeks which prompted this call.

## 2014-07-09 NOTE — Telephone Encounter (Signed)
PA for bisoprolol hctz was denied.  Patient must try and fail 2 formulary alternatives.  The denial letter did not provide formulary alternatives but I went on-line and printed out Cardiovascular Agents on [place] website. I will give it to you so you can review.

## 2014-07-09 NOTE — Telephone Encounter (Signed)
Pt son called to say pt is having a hard time getting around and is ask if Dr Elease Hashimoto will authorize a walker with wheels .

## 2014-07-09 NOTE — Telephone Encounter (Signed)
Rx sent to pharmacy   

## 2014-07-10 ENCOUNTER — Other Ambulatory Visit: Payer: Self-pay | Admitting: Family Medicine

## 2014-07-10 DIAGNOSIS — Z9181 History of falling: Secondary | ICD-10-CM

## 2014-07-10 NOTE — Telephone Encounter (Signed)
Order is sent to [facility] at [place].

## 2014-07-10 NOTE — Telephone Encounter (Signed)
Paper is in your folder.

## 2014-07-23 ENCOUNTER — Other Ambulatory Visit: Payer: Self-pay | Admitting: Urology

## 2014-07-23 ENCOUNTER — Telehealth: Payer: Self-pay | Admitting: Family Medicine

## 2014-07-23 MED ORDER — AMLODIPINE BESYLATE 5 MG PO TABS: 5.0000 mg | ORAL_TABLET | Freq: Every day | ORAL | Status: DC

## 2014-07-23 MED ORDER — SIMVASTATIN 20 MG PO TABS: 20.0000 mg | ORAL_TABLET | Freq: Every day | ORAL | Status: AC

## 2014-07-23 MED ORDER — LOSARTAN POTASSIUM 50 MG PO TABS
50.0000 mg | ORAL_TABLET | Freq: Every day | ORAL | Status: AC
Start: 2014-07-23 — End: ?

## 2014-07-23 NOTE — Telephone Encounter (Signed)
Pt needs refills on amlodipine 5 mg, simvastatin 20 mg and losartan 50 mg #30 each w/refills send to [facility] pharm/homecare

## 2014-07-23 NOTE — Telephone Encounter (Signed)
Rx sent to pharmacy   

## 2014-07-25 ENCOUNTER — Ambulatory Visit (INDEPENDENT_AMBULATORY_CARE_PROVIDER_SITE_OTHER): Payer: Medicare Other | Admitting: Family Medicine

## 2014-07-25 ENCOUNTER — Encounter: Payer: Self-pay | Admitting: Family Medicine

## 2014-07-25 VITALS — BP 130/66 | HR 78 | Temp 98.5°F | Wt 152.0 lb

## 2014-07-25 DIAGNOSIS — Z01818 Encounter for other preprocedural examination: Secondary | ICD-10-CM

## 2014-07-25 DIAGNOSIS — E119 Type 2 diabetes mellitus without complications: Secondary | ICD-10-CM

## 2014-07-25 DIAGNOSIS — I10 Essential (primary) hypertension: Secondary | ICD-10-CM | POA: Diagnosis not present

## 2014-07-25 NOTE — Progress Notes (Signed)
Pre visit review using our clinic review tool, if applicable. No additional management support is needed unless otherwise documented below in the visit note. 

## 2014-07-25 NOTE — Progress Notes (Signed)
   Subjective:    Patient ID: Kathleen Schroeder, female    DOB: 1931-02-16, 81 y.o.   MRN: [id number]  HPI Patient seen for preoperative surgical clearance. She was seen here back in February with weight loss, anorexia, malaise. Ultrasound revealed right renal mass worrisome for adenocarcinoma. She has been scheduled for right nephrectomy end of this month. She also has some right pulmonary nodules concerning for possible metastatic nodules. She was unable to confirm her medications today. Type 2 diabetes. Not monitoring blood sugars regularly. Never smoked. No history of lung disease. No history of CAD. No recent chest pains. Previous EKG couple months ago showed right bundle branch block. No acute changes.  Past Medical History  Diagnosis Date  . HYPERLIPIDEMIA 12/01/2009  . ESSENTIAL HYPERTENSION 12/16/2008  . ESOPHAGEAL STRICTURE 12/16/2008  . Heartburn 04/08/2008  . DYSPHAGIA 04/08/2008  . Cataract   . Depression   . DIABETES MELLITUS, CONTROLLED 09/01/2009  . GERD (gastroesophageal reflux disease)   . Glaucoma   . Barretts esophagus   . Hyperlipidemia    Past Surgical History  Procedure Laterality Date  . Cholecystectomy    . Abdominal hysterectomy  1984  . Esophagogastroduodenoscopy N/A 10/02/2012    Procedure: ESOPHAGOGASTRODUODENOSCOPY (EGD);  Surgeon: Rogene Houston, MD;  Location: [facility];  Service: Endoscopy;  Laterality: N/A;  . Foreign body removal N/A 10/02/2012    Procedure: FOREIGN BODY REMOVAL;  Surgeon: Rogene Houston, MD;  Location: [facility];  Service: Endoscopy;  Laterality: N/A;    reports that she has never smoked. She has never used smokeless tobacco. She reports that she does not drink alcohol or use illicit drugs. family history includes Cancer in her sister; Stroke in her mother. No Known Allergies    Review of Systems  Constitutional: Positive for appetite change and fatigue.  Eyes: Negative for visual disturbance.  Respiratory: Negative for  cough, chest tightness, shortness of breath and wheezing.   Cardiovascular: Negative for chest pain, palpitations and leg swelling.  Gastrointestinal: Negative for abdominal pain.  Neurological: Negative for dizziness, seizures, syncope, weakness, light-headedness and headaches.       Objective:   Physical Exam  Constitutional: She is oriented to person, place, and time. She appears well-developed and well-nourished.  Neck: Neck supple. No JVD present. No thyromegaly present.  No carotid bruits  Cardiovascular: Normal rate and regular rhythm.  Exam reveals no gallop.   Pulmonary/Chest: Effort normal and breath sounds normal. No respiratory distress. She has no wheezes. She has no rales.  Musculoskeletal: She exhibits no edema.  Neurological: She is alert and oriented to person, place, and time.          Assessment & Plan:  Preoperative clearance for right nephrectomy for right kidney mass. She has no history of any chronic lung or heart difficulties. No concerning symptoms. Obtain EKG. Prior history of right bundle branch block. Will need to have blood sugars monitored closely following her surgery  EKG RBBB pattern- unchanged from prior tracings with no acute changes.

## 2014-07-27 ENCOUNTER — Other Ambulatory Visit: Payer: Self-pay | Admitting: Family Medicine

## 2014-08-05 NOTE — Patient Instructions (Addendum)
JULIZZA SASSONE  08/05/2014   Your procedure is scheduled on: 08/12/2014    Report to [facility] Main  Entrance and follow signs to               [place] at      0900 AM.  Call this number if you have problems the morning of surgery [phone]   Remember:  Do not eat food or drink liquids :After Midnight.     Take these medicines the morning of surgery with A SIP OF WATER:  Albuterol Inhaler if needed and bring, Amlodipine ( NOrvasc), Alphagan and Cosopt eye drops, Nexium                                You may not have any metal on your body including hair pins and              piercings  Do not wear jewelry, make-up, lotions, powders or perfumes, deodorant.               Do not wear nail polish.  Do not shave  48 hours prior to surgery.     Do not bring valuables to the hospital. [place].  Contacts, dentures or bridgework may not be worn into surgery.  Leave suitcase in the car. After surgery it may be brought to your room.     Special Instructions: coughing and deep breathing exercises, leg exercises               Please read over the following fact sheets you were given: _____________________________________________________________________             [facility] - Preparing for Surgery Before surgery, you can play an important role.  Because skin is not sterile, your skin needs to be as free of germs as possible.  You can reduce the number of germs on your skin by washing with CHG (chlorahexidine gluconate) soap before surgery.  CHG is an antiseptic cleaner which kills germs and bonds with the skin to continue killing germs even after washing. Please DO NOT use if you have an allergy to CHG or antibacterial soaps.  If your skin becomes reddened/irritated stop using the CHG and inform your nurse when you arrive at Short Stay. Do not shave (including legs and underarms) for at least 48 hours  prior to the first CHG shower.  You may shave your face/neck. Please follow these instructions carefully:  1.  Shower with CHG Soap the night before surgery and the  morning of Surgery.  2.  If you choose to wash your hair, wash your hair first as usual with your  normal  shampoo.  3.  After you shampoo, rinse your hair and body thoroughly to remove the  shampoo.                           4.  Use CHG as you would any other liquid soap.  You can apply chg directly  to the skin and wash                       Gently with a scrungie or clean washcloth.  5.  Apply the CHG Soap to your body ONLY FROM THE NECK DOWN.   Do not use on face/ open                           Wound or open sores. Avoid contact with eyes, ears mouth and genitals (private parts).                       Wash face,  Genitals (private parts) with your normal soap.             6.  Wash thoroughly, paying special attention to the area where your surgery  will be performed.  7.  Thoroughly rinse your body with warm water from the neck down.  8.  DO NOT shower/wash with your normal soap after using and rinsing off  the CHG Soap.                9.  Pat yourself dry with a clean towel.            10.  Wear clean pajamas.            11.  Place clean sheets on your bed the night of your first shower and do not  sleep with pets. Day of Surgery : Do not apply any lotions/deodorants the morning of surgery.  Please wear clean clothes to the hospital/surgery center.  FAILURE TO FOLLOW THESE INSTRUCTIONS MAY RESULT IN THE CANCELLATION OF YOUR SURGERY PATIENT SIGNATURE_________________________________  NURSE SIGNATURE__________________________________  ________________________________________________________________________  WHAT IS A BLOOD TRANSFUSION? Blood Transfusion Information  A transfusion is the replacement of blood or some of its parts. Blood is made up of multiple cells which provide different functions.  Red blood cells carry  oxygen and are used for blood loss replacement.  White blood cells fight against infection.  Platelets control bleeding.  Plasma helps clot blood.  Other blood products are available for specialized needs, such as hemophilia or other clotting disorders. BEFORE THE TRANSFUSION  Who gives blood for transfusions?   Healthy volunteers who are fully evaluated to make sure their blood is safe. This is blood bank blood. Transfusion therapy is the safest it has ever been in the practice of medicine. Before blood is taken from a donor, a complete history is taken to make sure that person has no history of diseases nor engages in risky social behavior (examples are intravenous drug use or sexual activity with multiple partners). The donor's travel history is screened to minimize risk of transmitting infections, such as malaria. The donated blood is tested for signs of infectious diseases, such as HIV and hepatitis. The blood is then tested to be sure it is compatible with you in order to minimize the chance of a transfusion reaction. If you or a relative donates blood, this is often done in anticipation of surgery and is not appropriate for emergency situations. It takes many days to process the donated blood. RISKS AND COMPLICATIONS Although transfusion therapy is very safe and saves many lives, the main dangers of transfusion include:   Getting an infectious disease.  Developing a transfusion reaction. This is an allergic reaction to something in the blood you were given. Every precaution is taken to prevent this. The decision to have a blood transfusion has been considered carefully by your caregiver before blood is given. Blood is not given unless the benefits outweigh the risks. AFTER THE TRANSFUSION  Right after  receiving a blood transfusion, you will usually feel much better and more energetic. This is especially true if your red blood cells have gotten low (anemic). The transfusion raises the  level of the red blood cells which carry oxygen, and this usually causes an energy increase.  The nurse administering the transfusion will monitor you carefully for complications. HOME CARE INSTRUCTIONS  No special instructions are needed after a transfusion. You may find your energy is better. Speak with your caregiver about any limitations on activity for underlying diseases you may have. SEEK MEDICAL CARE IF:   Your condition is not improving after your transfusion.  You develop redness or irritation at the intravenous (IV) site. SEEK IMMEDIATE MEDICAL CARE IF:  Any of the following symptoms occur over the next 12 hours:  Shaking chills.  You have a temperature by mouth above 102 F (38.9 C), not controlled by medicine.  Chest, back, or muscle pain.  People around you feel you are not acting correctly or are confused.  Shortness of breath or difficulty breathing.  Dizziness and fainting.  You get a rash or develop hives.  You have a decrease in urine output.  Your urine turns a dark color or changes to pink, red, or brown. Any of the following symptoms occur over the next 10 days:  You have a temperature by mouth above 102 F (38.9 C), not controlled by medicine.  Shortness of breath.  Weakness after normal activity.  The white part of the eye turns yellow (jaundice).  You have a decrease in the amount of urine or are urinating less often.  Your urine turns a dark color or changes to pink, red, or brown. Document Released: 04/02/2000 Document Revised: 06/28/2011 Document Reviewed: 11/20/2007 [facility] Patient Information 2014 [place], [place].  _______________________________________________________________________

## 2014-08-06 ENCOUNTER — Encounter (HOSPITAL_COMMUNITY): Payer: Self-pay

## 2014-08-06 ENCOUNTER — Encounter (HOSPITAL_COMMUNITY)
Admission: RE | Admit: 2014-08-06 | Discharge: 2014-08-06 | Disposition: A | Discharge status: Home / Self Care | Payer: Medicare Other | Source: Ambulatory Visit | Attending: Urology | Admitting: Urology

## 2014-08-06 DIAGNOSIS — N2889 Other specified disorders of kidney and ureter: Secondary | ICD-10-CM | POA: Insufficient documentation

## 2014-08-06 DIAGNOSIS — Z01818 Encounter for other preprocedural examination: Secondary | ICD-10-CM | POA: Insufficient documentation

## 2014-08-06 LAB — COMPREHENSIVE METABOLIC PANEL
ALBUMIN: 2.7 g/dL — AB (ref 3.5–5.2)
ALK PHOS: 85 U/L (ref 39–117)
ALT: 17 U/L (ref 0–35)
AST: 32 U/L (ref 0–37)
Anion gap: 8 (ref 5–15)
BUN: 13 mg/dL (ref 6–23)
CHLORIDE: 96 mmol/L (ref 96–112)
CO2: 26 mmol/L (ref 19–32)
CREATININE: 0.66 mg/dL (ref 0.50–1.10)
Calcium: 9.4 mg/dL (ref 8.4–10.5)
GFR calc Af Amer: >90 mL/min (ref >90)
GFR, EST NON AFRICAN AMERICAN: 81 mL/min — AB (ref >90)
GLUCOSE: 170 mg/dL — AB (ref 70–99)
POTASSIUM: 4.4 mmol/L (ref 3.5–5.1)
Sodium: 130 mmol/L — ABNORMAL LOW (ref 135–145)
Total Bilirubin: 0.3 mg/dL (ref 0.3–1.2)
Total Protein: 8.1 g/dL (ref 6.0–8.3)

## 2014-08-06 LAB — CBC
HEMATOCRIT: 34.1 % — AB (ref 36.0–46.0)
HEMOGLOBIN: 10.8 g/dL — AB (ref 12.0–15.0)
MCH: 26.3 pg (ref 26.0–34.0)
MCHC: 31.7 g/dL (ref 30.0–36.0)
MCV: 83 fL (ref 78.0–100.0)
Platelets: 421 10*3/uL — ABNORMAL HIGH (ref 150–400)
RBC: 4.11 MIL/uL (ref 3.87–5.11)
RDW: 14.5 % (ref 11.5–15.5)
WBC: 9.1 10*3/uL (ref 4.0–10.5)

## 2014-08-06 NOTE — Progress Notes (Signed)
CBC done 08/06/14 faxed via EPIC to Dr Louis Meckel.

## 2014-08-06 NOTE — Progress Notes (Signed)
CT of chest done 05/2014 faxed via EPIC to Dr Louis Meckel.

## 2014-08-06 NOTE — Progress Notes (Signed)
CMP done 08/06/14 faxed via EPIC to Dr Louis Meckel.

## 2014-08-06 NOTE — Progress Notes (Signed)
EKG- 07/25/2014 EPIC  CT of chest- 06/06/14 EPIC  LOV with Dr Elease Hashimoto- 07/25/2014 EPIC

## 2014-08-12 ENCOUNTER — Encounter (HOSPITAL_COMMUNITY): Payer: Self-pay | Admitting: *Deleted

## 2014-08-12 ENCOUNTER — Inpatient Hospital Stay (HOSPITAL_COMMUNITY)
Admission: RE | Admit: 2014-08-12 | Discharge: 2014-08-16 | DRG: 657 | Disposition: A | Discharge status: Skilled Nursing Facility | Payer: Medicare Other | Source: Ambulatory Visit | Attending: Urology | Admitting: Urology

## 2014-08-12 ENCOUNTER — Encounter (HOSPITAL_COMMUNITY)
Admission: RE | Disposition: A | Discharge status: Skilled Nursing Facility | Payer: Self-pay | Source: Ambulatory Visit | Attending: Urology

## 2014-08-12 ENCOUNTER — Inpatient Hospital Stay (HOSPITAL_COMMUNITY): Payer: Medicare Other | Admitting: Certified Registered Nurse Anesthetist

## 2014-08-12 DIAGNOSIS — E46 Unspecified protein-calorie malnutrition: Secondary | ICD-10-CM | POA: Diagnosis present

## 2014-08-12 DIAGNOSIS — E119 Type 2 diabetes mellitus without complications: Secondary | ICD-10-CM | POA: Diagnosis present

## 2014-08-12 DIAGNOSIS — Z9049 Acquired absence of other specified parts of digestive tract: Secondary | ICD-10-CM | POA: Diagnosis present

## 2014-08-12 DIAGNOSIS — H409 Unspecified glaucoma: Secondary | ICD-10-CM | POA: Diagnosis present

## 2014-08-12 DIAGNOSIS — Z90710 Acquired absence of both cervix and uterus: Secondary | ICD-10-CM

## 2014-08-12 DIAGNOSIS — C641 Malignant neoplasm of right kidney, except renal pelvis: Principal | ICD-10-CM | POA: Diagnosis present

## 2014-08-12 DIAGNOSIS — Z87891 Personal history of nicotine dependence: Secondary | ICD-10-CM | POA: Diagnosis not present

## 2014-08-12 DIAGNOSIS — C649 Malignant neoplasm of unspecified kidney, except renal pelvis: Secondary | ICD-10-CM | POA: Diagnosis present

## 2014-08-12 DIAGNOSIS — I10 Essential (primary) hypertension: Secondary | ICD-10-CM | POA: Diagnosis present

## 2014-08-12 DIAGNOSIS — E785 Hyperlipidemia, unspecified: Secondary | ICD-10-CM | POA: Diagnosis present

## 2014-08-12 HISTORY — PX: LAPAROSCOPIC NEPHRECTOMY: SHX1930

## 2014-08-12 LAB — GLUCOSE, CAPILLARY
GLUCOSE-CAPILLARY: 156 mg/dL — AB (ref 70–99)
GLUCOSE-CAPILLARY: 205 mg/dL — AB (ref 70–99)

## 2014-08-12 LAB — TYPE AND SCREEN
ABO/RH(D): A POS
Antibody Screen: NEGATIVE

## 2014-08-12 LAB — HEMOGLOBIN AND HEMATOCRIT, BLOOD
HEMATOCRIT: 31.1 % — AB (ref 36.0–46.0)
HEMOGLOBIN: 10.1 g/dL — AB (ref 12.0–15.0)

## 2014-08-12 LAB — ABO/RH: ABO/RH(D): A POS

## 2014-08-12 SURGERY — NEPHRECTOMY, RADICAL, LAPAROSCOPIC, ADULT
Anesthesia: General | Laterality: Right

## 2014-08-12 MED ORDER — FENTANYL CITRATE (PF) 250 MCG/5ML IJ SOLN
INTRAMUSCULAR | Status: AC
  Filled 2014-08-12: qty 5

## 2014-08-12 MED ORDER — LIDOCAINE HCL (CARDIAC) 20 MG/ML IV SOLN
INTRAVENOUS | Status: AC
  Filled 2014-08-12: qty 5

## 2014-08-12 MED ORDER — FENTANYL CITRATE (PF) 100 MCG/2ML IJ SOLN: 25.0000 ug | INTRAMUSCULAR | Status: DC | PRN

## 2014-08-12 MED ORDER — BISOPROLOL FUMARATE 5 MG PO TABS
5.0000 mg | ORAL_TABLET | Freq: Once | ORAL | Status: DC
  Filled 2014-08-12: qty 1

## 2014-08-12 MED ORDER — DEXAMETHASONE SODIUM PHOSPHATE 10 MG/ML IJ SOLN
INTRAMUSCULAR | Status: AC
  Filled 2014-08-12: qty 1

## 2014-08-12 MED ORDER — PROMETHAZINE HCL 25 MG/ML IJ SOLN: 6.2500 mg | INTRAMUSCULAR | Status: DC | PRN

## 2014-08-12 MED ORDER — GLIMEPIRIDE 4 MG PO TABS
4.0000 mg | ORAL_TABLET | Freq: Every day | ORAL | Status: DC
  Administered 2014-08-13 – 2014-08-16 (×4): 4 mg via ORAL
  Filled 2014-08-12 (×7): qty 1

## 2014-08-12 MED ORDER — MORPHINE SULFATE 2 MG/ML IJ SOLN
2.0000 mg | INTRAMUSCULAR | Status: DC | PRN
  Administered 2014-08-12: 3 mg via INTRAVENOUS
  Administered 2014-08-12: 2 mg via INTRAVENOUS
  Administered 2014-08-13: 3 mg via INTRAVENOUS
  Administered 2014-08-13 (×3): 2 mg via INTRAVENOUS
  Filled 2014-08-12 (×4): qty 1
  Filled 2014-08-12: qty 2
  Filled 2014-08-12: qty 1
  Filled 2014-08-12: qty 2

## 2014-08-12 MED ORDER — ALBUMIN HUMAN 5 % IV SOLN
INTRAVENOUS | Status: DC | PRN
  Administered 2014-08-12: 14:00:00 via INTRAVENOUS

## 2014-08-12 MED ORDER — PROPOFOL 10 MG/ML IV BOLUS
INTRAVENOUS | Status: AC
  Filled 2014-08-12: qty 20

## 2014-08-12 MED ORDER — GLYCOPYRROLATE 0.2 MG/ML IJ SOLN
INTRAMUSCULAR | Status: AC
  Filled 2014-08-12: qty 3

## 2014-08-12 MED ORDER — BUPIVACAINE HCL 0.25 % IJ SOLN
INTRAMUSCULAR | Status: DC | PRN
  Administered 2014-08-12 (×2): 15 mL

## 2014-08-12 MED ORDER — ROCURONIUM BROMIDE 100 MG/10ML IV SOLN
INTRAVENOUS | Status: AC
  Filled 2014-08-12: qty 1

## 2014-08-12 MED ORDER — ACETAMINOPHEN 500 MG PO TABS
1000.0000 mg | ORAL_TABLET | Freq: Four times a day (QID) | ORAL | Status: DC
  Administered 2014-08-12 (×2): 1000 mg via ORAL
  Filled 2014-08-12 (×3): qty 2

## 2014-08-12 MED ORDER — PANTOPRAZOLE SODIUM 40 MG PO TBEC
80.0000 mg | DELAYED_RELEASE_TABLET | Freq: Every day | ORAL | Status: DC
  Administered 2014-08-13 – 2014-08-15 (×3): 80 mg via ORAL
  Filled 2014-08-12 (×3): qty 2

## 2014-08-12 MED ORDER — CEFAZOLIN SODIUM-DEXTROSE 2-3 GM-% IV SOLR
2.0000 g | Freq: Three times a day (TID) | INTRAVENOUS | Status: AC
Start: 2014-08-12 — End: 2014-08-13
  Administered 2014-08-12 – 2014-08-13 (×2): 2 g via INTRAVENOUS
  Filled 2014-08-12 (×2): qty 50

## 2014-08-12 MED ORDER — CEFAZOLIN SODIUM-DEXTROSE 2-3 GM-% IV SOLR
INTRAVENOUS | Status: AC
  Filled 2014-08-12: qty 50

## 2014-08-12 MED ORDER — EPHEDRINE SULFATE 50 MG/ML IJ SOLN
INTRAMUSCULAR | Status: AC
  Filled 2014-08-12: qty 1

## 2014-08-12 MED ORDER — ONDANSETRON HCL 4 MG/2ML IJ SOLN
4.0000 mg | INTRAMUSCULAR | Status: DC | PRN
  Administered 2014-08-12 – 2014-08-14 (×5): 4 mg via INTRAVENOUS
  Filled 2014-08-12 (×5): qty 2

## 2014-08-12 MED ORDER — SODIUM CHLORIDE 0.9 % IR SOLN
Status: DC | PRN
  Administered 2014-08-12: 1

## 2014-08-12 MED ORDER — ONDANSETRON HCL 4 MG/2ML IJ SOLN
INTRAMUSCULAR | Status: DC | PRN
  Administered 2014-08-12: 4 mg via INTRAVENOUS

## 2014-08-12 MED ORDER — DEXAMETHASONE SODIUM PHOSPHATE 10 MG/ML IJ SOLN
INTRAMUSCULAR | Status: DC | PRN
  Administered 2014-08-12: 10 mg via INTRAVENOUS

## 2014-08-12 MED ORDER — ALBUTEROL SULFATE (2.5 MG/3ML) 0.083% IN NEBU: 2.5000 mg | INHALATION_SOLUTION | Freq: Four times a day (QID) | RESPIRATORY_TRACT | Status: DC | PRN

## 2014-08-12 MED ORDER — PHENYLEPHRINE HCL 10 MG/ML IJ SOLN
INTRAMUSCULAR | Status: DC | PRN
  Administered 2014-08-12 (×2): 80 ug via INTRAVENOUS

## 2014-08-12 MED ORDER — BUPIVACAINE-EPINEPHRINE (PF) 0.25% -1:200000 IJ SOLN
INTRAMUSCULAR | Status: AC
  Filled 2014-08-12: qty 30

## 2014-08-12 MED ORDER — BUPIVACAINE LIPOSOME 1.3 % IJ SUSP
20.0000 mL | Freq: Once | INTRAMUSCULAR | Status: AC
  Administered 2014-08-12: 20 mL
  Filled 2014-08-12: qty 20

## 2014-08-12 MED ORDER — CEFAZOLIN SODIUM-DEXTROSE 2-3 GM-% IV SOLR
2.0000 g | INTRAVENOUS | Status: AC
  Administered 2014-08-12: 2 g via INTRAVENOUS

## 2014-08-12 MED ORDER — CISATRACURIUM BESYLATE 20 MG/10ML IV SOLN
INTRAVENOUS | Status: AC
  Filled 2014-08-12: qty 20

## 2014-08-12 MED ORDER — BISOPROLOL-HYDROCHLOROTHIAZIDE 5-6.25 MG PO TABS
1.0000 | ORAL_TABLET | Freq: Every day | ORAL | Status: DC
  Administered 2014-08-13 – 2014-08-15 (×3): 1 via ORAL
  Filled 2014-08-12 (×4): qty 1

## 2014-08-12 MED ORDER — CISATRACURIUM BESYLATE (PF) 10 MG/5ML IV SOLN
INTRAVENOUS | Status: DC | PRN
  Administered 2014-08-12: 4 mg via INTRAVENOUS
  Administered 2014-08-12: 2 mg via INTRAVENOUS
  Administered 2014-08-12 (×3): 4 mg via INTRAVENOUS
  Administered 2014-08-12: 6 mg via INTRAVENOUS
  Administered 2014-08-12 (×2): 4 mg via INTRAVENOUS

## 2014-08-12 MED ORDER — OXYBUTYNIN CHLORIDE 5 MG PO TABS
5.0000 mg | ORAL_TABLET | Freq: Three times a day (TID) | ORAL | Status: DC | PRN
  Administered 2014-08-15 – 2014-08-16 (×2): 5 mg via ORAL
  Filled 2014-08-12 (×2): qty 1

## 2014-08-12 MED ORDER — PHENYLEPHRINE 40 MCG/ML (10ML) SYRINGE FOR IV PUSH (FOR BLOOD PRESSURE SUPPORT)
PREFILLED_SYRINGE | INTRAVENOUS | Status: AC
  Filled 2014-08-12: qty 10

## 2014-08-12 MED ORDER — ONDANSETRON HCL 4 MG/2ML IJ SOLN
INTRAMUSCULAR | Status: AC
  Filled 2014-08-12: qty 2

## 2014-08-12 MED ORDER — NEOSTIGMINE METHYLSULFATE 10 MG/10ML IV SOLN
INTRAVENOUS | Status: AC
  Filled 2014-08-12: qty 1

## 2014-08-12 MED ORDER — NEOSTIGMINE METHYLSULFATE 10 MG/10ML IV SOLN
INTRAVENOUS | Status: DC | PRN
  Administered 2014-08-12: 4 mg via INTRAVENOUS

## 2014-08-12 MED ORDER — ALBUMIN HUMAN 5 % IV SOLN
INTRAVENOUS | Status: AC
  Filled 2014-08-12: qty 250

## 2014-08-12 MED ORDER — PROPOFOL 10 MG/ML IV BOLUS
INTRAVENOUS | Status: DC | PRN
  Administered 2014-08-12: 100 mg via INTRAVENOUS

## 2014-08-12 MED ORDER — SUCCINYLCHOLINE CHLORIDE 20 MG/ML IJ SOLN
INTRAMUSCULAR | Status: DC | PRN
  Administered 2014-08-12: 100 mg via INTRAVENOUS

## 2014-08-12 MED ORDER — LIP MEDEX EX OINT
TOPICAL_OINTMENT | CUTANEOUS | Status: AC
  Filled 2014-08-12: qty 7

## 2014-08-12 MED ORDER — SENNOSIDES-DOCUSATE SODIUM 8.6-50 MG PO TABS
2.0000 | ORAL_TABLET | Freq: Every day | ORAL | Status: DC
  Administered 2014-08-12 – 2014-08-15 (×4): 2 via ORAL
  Filled 2014-08-12 (×4): qty 2

## 2014-08-12 MED ORDER — SODIUM CHLORIDE 0.9 % IJ SOLN
INTRAMUSCULAR | Status: AC
  Filled 2014-08-12: qty 20

## 2014-08-12 MED ORDER — LACTATED RINGERS IV SOLN
INTRAVENOUS | Status: DC
  Administered 2014-08-12 – 2014-08-14 (×4): via INTRAVENOUS

## 2014-08-12 MED ORDER — SIMVASTATIN 20 MG PO TABS
20.0000 mg | ORAL_TABLET | Freq: Every day | ORAL | Status: DC
  Administered 2014-08-12 – 2014-08-15 (×4): 20 mg via ORAL
  Filled 2014-08-12: qty 1
  Filled 2014-08-12: qty 2
  Filled 2014-08-12 (×2): qty 1

## 2014-08-12 MED ORDER — AMLODIPINE BESYLATE 5 MG PO TABS
5.0000 mg | ORAL_TABLET | Freq: Every day | ORAL | Status: DC
  Administered 2014-08-13 – 2014-08-15 (×3): 5 mg via ORAL
  Filled 2014-08-12 (×3): qty 1

## 2014-08-12 MED ORDER — METFORMIN HCL 500 MG PO TABS
1000.0000 mg | ORAL_TABLET | Freq: Two times a day (BID) | ORAL | Status: DC
  Administered 2014-08-13 – 2014-08-16 (×7): 1000 mg via ORAL
  Filled 2014-08-12 (×9): qty 2

## 2014-08-12 MED ORDER — FENTANYL CITRATE (PF) 100 MCG/2ML IJ SOLN
INTRAMUSCULAR | Status: AC
  Filled 2014-08-12: qty 2

## 2014-08-12 MED ORDER — LIP MEDEX EX OINT
TOPICAL_OINTMENT | CUTANEOUS | Status: AC
  Administered 2014-08-12: 1
  Filled 2014-08-12: qty 7

## 2014-08-12 MED ORDER — OXYCODONE HCL 5 MG PO TABS
5.0000 mg | ORAL_TABLET | ORAL | Status: DC | PRN
  Administered 2014-08-13 – 2014-08-14 (×2): 5 mg via ORAL
  Filled 2014-08-12 (×2): qty 1

## 2014-08-12 MED ORDER — BRIMONIDINE TARTRATE 0.15 % OP SOLN
1.0000 [drp] | Freq: Two times a day (BID) | OPHTHALMIC | Status: DC
  Administered 2014-08-13 – 2014-08-15 (×5): 1 [drp] via OPHTHALMIC
  Filled 2014-08-12: qty 5

## 2014-08-12 MED ORDER — GLYCOPYRROLATE 0.2 MG/ML IJ SOLN
INTRAMUSCULAR | Status: DC | PRN
  Administered 2014-08-12: 0.6 mg via INTRAVENOUS

## 2014-08-12 MED ORDER — DORZOLAMIDE HCL-TIMOLOL MAL 2-0.5 % OP SOLN
1.0000 [drp] | Freq: Two times a day (BID) | OPHTHALMIC | Status: DC
  Administered 2014-08-13 – 2014-08-15 (×5): 1 [drp] via OPHTHALMIC
  Filled 2014-08-12: qty 10

## 2014-08-12 MED ORDER — LACTATED RINGERS IV SOLN
INTRAVENOUS | Status: DC
  Administered 2014-08-12: 17:00:00 via INTRAVENOUS
  Administered 2014-08-12: 1000 mL via INTRAVENOUS
  Administered 2014-08-12: 14:00:00 via INTRAVENOUS

## 2014-08-12 MED ORDER — FENTANYL CITRATE (PF) 250 MCG/5ML IJ SOLN
INTRAMUSCULAR | Status: DC | PRN
  Administered 2014-08-12: 25 ug via INTRAVENOUS
  Administered 2014-08-12: 50 ug via INTRAVENOUS
  Administered 2014-08-12 (×2): 25 ug via INTRAVENOUS
  Administered 2014-08-12: 50 ug via INTRAVENOUS
  Administered 2014-08-12 (×4): 25 ug via INTRAVENOUS
  Administered 2014-08-12: 50 ug via INTRAVENOUS
  Administered 2014-08-12: 25 ug via INTRAVENOUS

## 2014-08-12 MED ORDER — SODIUM CHLORIDE 0.9 % IJ SOLN
INTRAMUSCULAR | Status: AC
  Filled 2014-08-12: qty 10

## 2014-08-12 SURGICAL SUPPLY — 58 items
APL ESCP 34 STRL LF DISP (HEMOSTASIS) ×1
APPLICATOR SURGIFLO ENDO (HEMOSTASIS) ×3 IMPLANT
APPLIER CLIP ROT 10 11.4 M/L (STAPLE)
APR CLP MED LRG 11.4X10 (STAPLE)
BAG ZIPLOCK 12X15 (MISCELLANEOUS) ×3 IMPLANT
BLADE EXTENDED COATED 6.5IN (ELECTRODE) IMPLANT
BLADE SURG SZ10 CARB STEEL (BLADE) ×3 IMPLANT
CHLORAPREP W/TINT 26ML (MISCELLANEOUS) ×3 IMPLANT
CLIP APPLIE ROT 10 11.4 M/L (STAPLE) IMPLANT
CLIP LIGATING HEM O LOK PURPLE (MISCELLANEOUS) ×3 IMPLANT
CLIP LIGATING HEMO LOK XL GOLD (MISCELLANEOUS) IMPLANT
CLIP LIGATING HEMO O LOK GREEN (MISCELLANEOUS) ×3 IMPLANT
COVER SURGICAL LIGHT HANDLE (MISCELLANEOUS) ×3 IMPLANT
CUTTER FLEX LINEAR 45M (STAPLE) ×3 IMPLANT
DRAIN CHANNEL 10F 3/8 F FF (DRAIN) IMPLANT
DRAPE INCISE IOBAN 66X45 STRL (DRAPES) ×3 IMPLANT
DRAPE WARM FLUID 44X44 (DRAPE) IMPLANT
ELECT REM PT RETURN 9FT ADLT (ELECTROSURGICAL) ×3
ELECTRODE REM PT RTRN 9FT ADLT (ELECTROSURGICAL) ×1 IMPLANT
EVACUATOR SILICONE 100CC (DRAIN) IMPLANT
FLOSEAL 10ML (HEMOSTASIS) IMPLANT
GLOVE BIOGEL M STRL SZ7.5 (GLOVE) ×3 IMPLANT
GOWN STRL REUS W/TWL LRG LVL3 (GOWN DISPOSABLE) ×6 IMPLANT
HEMOSTAT SURGICEL 4X8 (HEMOSTASIS) ×3 IMPLANT
KIT BASIN OR (CUSTOM PROCEDURE TRAY) ×3 IMPLANT
LIQUID BAND (GAUZE/BANDAGES/DRESSINGS) ×3 IMPLANT
MANIFOLD NEPTUNE II (INSTRUMENTS) ×3 IMPLANT
NEEDLE SPNL 22GX2.5 QUINCKE BK (NEEDLE) ×3 IMPLANT
PENCIL BUTTON HOLSTER BLD 10FT (ELECTRODE) ×3 IMPLANT
POSITIONER SURGICAL ARM (MISCELLANEOUS) ×6 IMPLANT
POUCH ENDO CATCH II 15MM (MISCELLANEOUS) ×3 IMPLANT
RELOAD 45 VASCULAR/THIN (ENDOMECHANICALS) ×6 IMPLANT
RELOAD STAPLE TA45 3.5 REG BLU (ENDOMECHANICALS) IMPLANT
RETRACTOR LAPSCP 12X46 CVD (ENDOMECHANICALS) IMPLANT
RTRCTR LAPSCP 12X46 CVD (ENDOMECHANICALS)
SCISSORS LAP 5X35 DISP (ENDOMECHANICALS) ×3 IMPLANT
SET IRRIG TUBING LAPAROSCOPIC (IRRIGATION / IRRIGATOR) ×3 IMPLANT
SHEARS HARMONIC ACE PLUS 36CM (ENDOMECHANICALS) ×3 IMPLANT
SLEEVE XCEL OPT CAN 5 100 (ENDOMECHANICALS) ×6 IMPLANT
SPONGE LAP 4X18 X RAY DECT (DISPOSABLE) IMPLANT
SPONGE SURGIFOAM ABS GEL 100 (HEMOSTASIS) IMPLANT
SUT ETHILON 3 0 PS 1 (SUTURE) IMPLANT
SUT MNCRL AB 4-0 PS2 18 (SUTURE) ×6 IMPLANT
SUT PDS AB 0 CT1 36 (SUTURE) ×6 IMPLANT
SUT VIC AB 0 CT1 36 (SUTURE) ×3 IMPLANT
SUT VIC AB 1 CT1 27 (SUTURE) ×2
SUT VIC AB 1 CT1 27XBRD ANTBC (SUTURE) ×1 IMPLANT
SUT VIC AB 2-0 CT1 27 (SUTURE) ×3
SUT VIC AB 2-0 CT1 27XBRD (SUTURE) ×1 IMPLANT
SUT VICRYL 0 UR6 27IN ABS (SUTURE) ×9 IMPLANT
TOWEL OR 17X26 10 PK STRL BLUE (TOWEL DISPOSABLE) ×3 IMPLANT
TRAY FOLEY CATH 16FR SILVER (SET/KITS/TRAYS/PACK) IMPLANT
TRAY FOLEY W/METER SILVER 16FR (SET/KITS/TRAYS/PACK) ×3 IMPLANT
TRAY LAPAROSCOPIC (CUSTOM PROCEDURE TRAY) ×3 IMPLANT
TROCAR BLADELESS OPT 5 100 (ENDOMECHANICALS) ×3 IMPLANT
TROCAR UNIVERSAL OPT 12M 100M (ENDOMECHANICALS) ×3 IMPLANT
TROCAR XCEL 12X100 BLDLESS (ENDOMECHANICALS) ×3 IMPLANT
YANKAUER SUCT BULB TIP 10FT TU (MISCELLANEOUS) ×3 IMPLANT

## 2014-08-12 NOTE — H&P (Addendum)
Reason For Visit Follow-up for right renal mass.   History of Present Illness 81 year old female who was referred to me by Dr. Lowella Bandy, M.D. for definitive management of a right renal mass. The mass was discovered as part of a workup for fatigue and weight loss. She was noted to have a approximate 8 cm right lower pole renal mass. She also was found to have bilateral pulmonary nodules, one of them measuring 7 mm. The patient recently underwent MRI for better characterization of the renal mass and to assess whether or not there is involvement of the renal vein. The study was limited by motion artifact and renal vein involvement was not ruled out. The mass appears to have grown somewhat over the interval. The patient relates ongoing fatigue, lack of appetite, and weight loss. In addition she has had night sweats. She has become more deconditioned. She denies any pain. She has had a dry cough. She denies any hemoptysis. She denies any GI symptoms including constipation or diarrhea.  The patient has a past medical history significant for well-controlled diabetes as well as hypertension. She is a lifelong nonsmoker.  The patient has a surgical history of a transient vaginal hysterectomy and a laparoscopic cholecystectomy. Her laparoscopic cholecystectomy was about 15 years ago. She tolerated her surgeries without any anesthetic complication.   Past Medical History Problems  1. History of diabetes mellitus (Z86.39) 2. History of glaucoma (Z86.69) 3. History of heartburn (Z87.898) 4. History of hypertension (Z86.79)  Surgical History Problems  1. History of Cholecystectomy 2. History of Hysterectomy  Current Meds 1. Albuterol 90 MCG/ACT AERS;  Therapy: (Recorded:09Mar2016) to Recorded 2. AmLODIPine Besylate 5 MG Oral Tablet;  Therapy: (Recorded:09Mar2016) to Recorded 3. Azithromycin 250 MG Oral Tablet;  Therapy: (Recorded:09Mar2016) to Recorded 4. Bisoprolol Fumarate-HCTZ 5-6.25 MG TABS;  Therapy: (Recorded:09Mar2016) to Recorded 5. Brimonidine Tartrate 0.15 % Ophthalmic Solution;  Therapy: (Recorded:09Mar2016) to Recorded 6. Cosopt 22.3-6.8 MG/ML Ophthalmic Solution (Dorzolamide HCl-Timolol Mal);  Therapy: (Recorded:09Mar2016) to Recorded 7. Glimepiride 2 MG Oral Tablet;  Therapy: (Recorded:09Mar2016) to Recorded 8. Losartan Potassium 50 MG Oral Tablet;  Therapy: (Recorded:09Mar2016) to Recorded 9. MetFORMIN HCl - 500 MG Oral Tablet;  Therapy: (Recorded:09Mar2016) to Recorded 10. NexIUM 40 MG Oral Capsule Delayed Release (Esomeprazole Magnesium);   Therapy: (Recorded:09Mar2016) to Recorded 11. Simvastatin 20 MG Oral Tablet;   Therapy: (Recorded:09Mar2016) to Recorded  Allergies Medication  1. No Known Drug Allergies  Family History Problems  1. Family history of myocardial infarction (Z82.49) : Father 2. Family history of stroke (Z82.3) : Mother  Social History Problems  1. Denied: History of Alcohol use 2. Caffeine use (F15.90)   3 3. Father deceased   HEART ATTACK 65 4. Former smoker [phone]) 5. Married 6. Mother deceased   STROKE 76 7. Number of children   2 SONS 8. Retired  Physical Exam Constitutional: Well nourished and well developed . No acute distress.   ENT:. The ears and nose are normal in appearance.   Neck: The appearance of the neck is normal and no neck mass is present.   Pulmonary: No respiratory distress and normal respiratory rhythm and effort.   Cardiovascular: Heart rate and rhythm are normal . No peripheral edema.   Abdomen: The abdomen is soft and nontender. No masses are palpated. No CVA tenderness. No hernias are palpable. No hepatosplenomegaly noted.   Lymphatics: The femoral and inguinal nodes are not enlarged or tender.   Skin: Normal skin turgor, no visible rash and no visible skin lesions.  Neuro/Psych:. Mood and affect are appropriate.    Results/Data Urine [Data Includes: Last 1 Day]   04Apr2016  COLOR  YELLOW   APPEARANCE CLEAR   SPECIFIC GRAVITY 1.020   pH 6.5   GLUCOSE 500 mg/dL  BILIRUBIN NEG   KETONE NEG mg/dL  BLOOD NEG   PROTEIN NEG mg/dL  UROBILINOGEN 2 mg/dL  NITRITE NEG   LEUKOCYTE ESTERASE NEG    I have independently reviewed the patient's MRI as well as her CT scans. The patient has a large renal mass in the lower pole of the right kidney extending centrally to the hilum. I'm on sure as to whether this invades into the renal vein. In addition, the patient is noted to have bilateral pulmonary nodules. The largest one measuring 7 mm. These are concerning for metastatic disease.   Assessment Large right renal mass   Plan Health Maintenance  1. UA With REFLEX; [Do Not Release]; Status:Complete;   Done: [id number] 03:26PM Renal neoplasm  2. Follow-up Schedule Surgery Office  Follow-up  Status: Hold For - Appointment   Requested for: 04Apr2016  Discussion/Summary I detailed the various treatment options with the patient and ultimately I recommended a laparoscopic radical nephrectomy. I went over this operation in detail with the patient including the risks and benefits. We discussed port position and I outlined the position of the 4 planned trocars. I also explained to her that she will need extraction incision which typically is in the left lower quadrant. I discussed the actual surgery with her and we went over the various structures that are in intimate association with the kidney and the risk of damage thereof. I outlined the risk of injury to the major nerves and vessels in proximity to the kidney. I explained the patient the expected hospital course. I told her that she should plan to be in the hospital at least 2-3 days. Further, I told him that she would likely need approximately 1 month to fully recover. Given the severity of this planned operation, we will have the patient be seen by his primary care doctor and cleared for surgery. We will plan to schedule this within the  next month.  I caution the patient that she would likely need skilled nursing facility postoperatively given her frailty going into the surgery. She may need additional therapy or procedures for her pulmonary nodules. We will follow these closely after surgery. I would like the patient to have cardiac clearance from her primary care provider prior to proceeding with the operation. However, we'll get this scheduled as soon as possible given the size of the tumor and the worrisome findings on her MRI.    Addendum: Cleared by Dr. Elease Hashimoto, MD her PCP for surgery.  She was found to have an old RBBB and mildly elevated BG.

## 2014-08-12 NOTE — Discharge Instructions (Signed)

## 2014-08-12 NOTE — Anesthesia Procedure Notes (Signed)
Procedure Name: Intubation Date/Time: 08/12/2014 1:12 PM Performed by: Dione Booze Pre-anesthesia Checklist: Patient identified, Emergency Drugs available, Suction available and Patient being monitored Patient Re-evaluated:Patient Re-evaluated prior to inductionOxygen Delivery Method: Circle system utilized Preoxygenation: Pre-oxygenation with 100% oxygen Intubation Type: IV induction Laryngoscope Size: Mac and 4 Tube size: 7.5 mm Number of attempts: 1 Airway Equipment and Method: Stylet Placement Confirmation: ETT inserted through vocal cords under direct vision,  positive ETCO2 and breath sounds checked- equal and bilateral Secured at: 20 cm Tube secured with: Tape

## 2014-08-12 NOTE — Anesthesia Postprocedure Evaluation (Signed)
  Anesthesia Post-op Note  Patient: Kathleen Schroeder  Procedure(s) Performed: Procedure(s) (LRB): RIGHT LAPAROSCOPIC RADICAL NEPHRECTOMY (Right)  Patient Location: PACU  Anesthesia Type: General  Level of Consciousness: awake and alert   Airway and Oxygen Therapy: Patient Spontanous Breathing  Post-op Pain: mild  Post-op Assessment: Post-op Vital signs reviewed, Patient's Cardiovascular Status Stable, Respiratory Function Stable, Patent Airway and No signs of Nausea or vomiting  Last Vitals:  Filed Vitals:   08/12/14 1830  BP: 157/67  Pulse: 87  Temp: 36.9 C  Resp: 15    Post-op Vital Signs: stable   Complications: No apparent anesthesia complications

## 2014-08-12 NOTE — Anesthesia Preprocedure Evaluation (Signed)
Anesthesia Evaluation  Patient identified by MRN, date of birth, ID band Patient awake    Reviewed: Allergy & Precautions, NPO status , Patient's Chart, lab work & pertinent test results  Airway Mallampati: II  TM Distance: >3 FB Neck ROM: Full    Dental no notable dental hx. (+) Edentulous Upper, Edentulous Lower   Pulmonary neg pulmonary ROS,  breath sounds clear to auscultation  Pulmonary exam normal       Cardiovascular hypertension, Pt. on medications Rhythm:Regular Rate:Normal     Neuro/Psych negative neurological ROS  negative psych ROS   GI/Hepatic Neg liver ROS, GERD-  Controlled and Medicated,  Endo/Other  diabetes, Type 2, Oral Hypoglycemic Agents  Renal/GU negative Renal ROS  negative genitourinary   Musculoskeletal negative musculoskeletal ROS (+)   Abdominal   Peds negative pediatric ROS (+)  Hematology negative hematology ROS (+)   Anesthesia Other Findings   Reproductive/Obstetrics negative OB ROS                             Anesthesia Physical Anesthesia Plan  ASA: II  Anesthesia Plan: General   Post-op Pain Management:    Induction: Intravenous  Airway Management Planned: Oral ETT  Additional Equipment:   Intra-op Plan:   Post-operative Plan: Extubation in OR  Informed Consent: I have reviewed the patients History and Physical, chart, labs and discussed the procedure including the risks, benefits and alternatives for the proposed anesthesia with the patient or authorized representative who has indicated his/her understanding and acceptance.   Dental advisory given  Plan Discussed with: CRNA  Anesthesia Plan Comments:         Anesthesia Quick Evaluation

## 2014-08-12 NOTE — Op Note (Signed)
Preoperative diagnosis:  1. Right renal mass   Postoperative diagnosis:  1. same   Procedure:  1. Laparoscopic right radical nephrectomy  Surgeon: Ardis Hughs, MD Resident Assistant: Dr. Amaryllis Dyke, MD    Anesthesia: General  Complications: None  Intraoperative findings: Tortuous renal artery with early bifurcation, renal vein also had several early branches. 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 Adrenal gland was spared.  EBL: 200 mL  Specimens:  Right kidney and proximal ureter  Indication: ALMARIE KURDZIEL is a 81 y.o. patient with large right renal mass.  After reviewing the management options for treatment, he elected to proceed with the above surgical procedure(s). We have discussed the potential benefits and risks of the procedure, side effects of the proposed treatment, the likelihood of the patient achieving the goals of the procedure, and any potential problems that might occur during the procedure or recuperation. Informed consent has been obtained.  Description of procedure:  A site was selected lateral to the umbilicus for placement of the camera port. This was placed using a standard open Hassan technique which allowed entry into the peritoneal cavity under direct vision and without difficulty. A 12 mm Hassan cannula was placed and a pneumoperitoneum established. The camera was then used to inspect the abdomen and there was no evidence of any intra-abdominal injuries or other abnormalities. The remaining abdominal ports were then placed under visual guidance.  A second 12 mm port was placed in the right lower quadrant approximately 8 cm away from the camera. A 5 mm port was placed in the right upper quadrant again 8 cm away from the camera. A second 5 mm port was placed just inferior to the xiphoid process in the midline, this was used as a liver retractor.  An additional 5 mm port was then placed in the  right lower quadrant at the anterior axillary line lateral to the 12 mm port. All ports were placed under direct vision without difficulty.   The white line of Toldt was incised allowing the colon to be mobilized medially and the plane between the mesocolon and the anterior layer of Gerota's fascia to be developed and the kidney exposed. The ureter and gonadal vein were identified inferiorly and the ureter was lifted anteriorly off the psoas muscle. Dissection proceeded superiorly along the gonadal vein until the renal vein was identified. The renal hilum was then carefully isolated with a combination of blunt and sharp dissectiong allowing the renal arterial and venous structures to be separated and isolated.   The renal artery was isolated and ligated using a 45 mm vascular load from the Flex ETS stapler. The renal vein was then isolated and also ligated and divided with a second 45 mm load from the flex ETS stapler. Gerota's fascia was intentionally entered superiorly and the space between the adrenal gland and the kidney was developed allowing the adrenal gland to be spared. The hepatorenal ligaments were divided.. The lateral and posterior attachements to the kidney were then divided. Once the kidney was free from its attachments  40cc of 0.25% rupivocaine was then injected into the right anterior axillary line b/w the iliac crest and the twelfth rib under laparoscopic guidance. The layer between the tranversus abdominus and the internal oblique was targeted.   The kidney/ureter specimen was then placed into a 15 mm Endocatch II retrieval bag. The renal hilum, liver, adrenal bed and gonadal vein areas were each inspected and hemostasis  was ensured with the pneomperitoneal pressures lowered. Surgicel was then placed over the hilum.  The camera was then brought to the 12 mm port laterally and the camera port was removed and the fascia closed using a [place] needle with a 0 Vicryl.  All the ports  were then removed under visual guidance. The lateral 12 mm port and 5 mm port were then connected sharply with a 15 blade. Then opened this incision down to the external oblique fascia. We then spread the muscle fibers down to the internal oblique fascia which we then opened with cautery. These were then spread in all muscle spared and the posterior peritoneum was opened. The rectus muscle was pulled medially. The specimen was then removed through these incision. The internal oblique fascia was then closed with a 0 Vicryl in a running fashion. The external oblique fascia was then closed with a 0 PDS in a running fashion.  All incisions were injected with local anesthetic and reapproximated at the skin with 4-0 monocryl sutures. Dermabond was applied to the skin. The patient tolerated the procedure well and without complications and was transferred to the recovery unit in satisfactory condition.   Ardis Hughs, M.D.

## 2014-08-12 NOTE — Transfer of Care (Signed)
Immediate Anesthesia Transfer of Care Note  Patient: Kathleen Schroeder  Procedure(s) Performed: Procedure(s): RIGHT LAPAROSCOPIC RADICAL NEPHRECTOMY (Right)  Patient Location: PACU  Anesthesia Type:General  Level of Consciousness: awake, alert , oriented and patient cooperative  Airway & Oxygen Therapy: Patient Spontanous Breathing and Patient connected to face mask oxygen  Post-op Assessment: Report given to RN and Post -op Vital signs reviewed and stable  Post vital signs: Reviewed and stable  Last Vitals:  Filed Vitals:   08/12/14 0859  BP: 112/96  Pulse: 79  Temp: 36.5 C  Resp: 16    Complications: No apparent anesthesia complications

## 2014-08-13 ENCOUNTER — Encounter (HOSPITAL_COMMUNITY): Payer: Self-pay | Admitting: *Deleted

## 2014-08-13 LAB — CBC
HEMATOCRIT: 32.4 % — AB (ref 36.0–46.0)
Hemoglobin: 10.1 g/dL — ABNORMAL LOW (ref 12.0–15.0)
MCH: 25.3 pg — AB (ref 26.0–34.0)
MCHC: 31.2 g/dL (ref 30.0–36.0)
MCV: 81 fL (ref 78.0–100.0)
Platelets: 324 10*3/uL (ref 150–400)
RBC: 4 MIL/uL (ref 3.87–5.11)
RDW: 14.3 % (ref 11.5–15.5)
WBC: 8.1 10*3/uL (ref 4.0–10.5)

## 2014-08-13 LAB — BASIC METABOLIC PANEL
Anion gap: 4 — ABNORMAL LOW (ref 5–15)
BUN: 10 mg/dL (ref 6–23)
CO2: 28 mmol/L (ref 19–32)
Calcium: 8.3 mg/dL — ABNORMAL LOW (ref 8.4–10.5)
Chloride: 93 mmol/L — ABNORMAL LOW (ref 96–112)
Creatinine, Ser: 0.88 mg/dL (ref 0.50–1.10)
GFR, EST AFRICAN AMERICAN: 70 mL/min — AB (ref >90)
GFR, EST NON AFRICAN AMERICAN: 60 mL/min — AB (ref >90)
GLUCOSE: 257 mg/dL — AB (ref 70–99)
POTASSIUM: 3.9 mmol/L (ref 3.5–5.1)
SODIUM: 125 mmol/L — AB (ref 135–145)

## 2014-08-13 LAB — GLUCOSE, CAPILLARY
GLUCOSE-CAPILLARY: 169 mg/dL — AB (ref 70–99)
GLUCOSE-CAPILLARY: 201 mg/dL — AB (ref 70–99)
Glucose-Capillary: 210 mg/dL — ABNORMAL HIGH (ref 70–99)
Glucose-Capillary: 191 mg/dL — ABNORMAL HIGH (ref 70–99)
Glucose-Capillary: 210 mg/dL — ABNORMAL HIGH (ref 70–99)

## 2014-08-13 MED ORDER — CETYLPYRIDINIUM CHLORIDE 0.05 % MT LIQD
7.0000 mL | Freq: Two times a day (BID) | OROMUCOSAL | Status: DC
  Administered 2014-08-13 – 2014-08-15 (×5): 7 mL via OROMUCOSAL

## 2014-08-13 MED ORDER — ENSURE ENLIVE PO LIQD
237.0000 mL | Freq: Three times a day (TID) | ORAL | Status: DC
  Administered 2014-08-13 – 2014-08-15 (×8): 237 mL via ORAL

## 2014-08-13 MED ORDER — INSULIN ASPART 100 UNIT/ML ~~LOC~~ SOLN
0.0000 [IU] | SUBCUTANEOUS | Status: DC
  Administered 2014-08-13: 3 [IU] via SUBCUTANEOUS
  Administered 2014-08-13: 2 [IU] via SUBCUTANEOUS
  Administered 2014-08-13 (×2): 3 [IU] via SUBCUTANEOUS
  Administered 2014-08-14 (×3): 1 [IU] via SUBCUTANEOUS
  Administered 2014-08-14 – 2014-08-15 (×3): 2 [IU] via SUBCUTANEOUS

## 2014-08-13 MED ORDER — HYDRALAZINE HCL 20 MG/ML IJ SOLN: 5.0000 mg | INTRAMUSCULAR | Status: DC | PRN

## 2014-08-13 MED ORDER — ACETAMINOPHEN 10 MG/ML IV SOLN
1000.0000 mg | Freq: Four times a day (QID) | INTRAVENOUS | Status: AC
Start: 2014-08-13 — End: 2014-08-14
  Administered 2014-08-13 – 2014-08-14 (×4): 1000 mg via INTRAVENOUS
  Filled 2014-08-13 (×8): qty 100

## 2014-08-13 NOTE — Care Management Note (Signed)
  Page 1 of 1   08/13/2014     10:27:15 AM CARE MANAGEMENT NOTE 08/13/2014  Patient:  Kathleen Schroeder, Kathleen Schroeder   Account Number:  [id number]  Date Initiated:  08/13/2014  Documentation initiated by:  DAVIS,RHONDA  Subjective/Objective Assessment:   1.   Laparoscopic right radical nephrectomy     Action/Plan:   home when stable /will follow for hhc needs   Anticipated DC Date:  08/16/2014   Anticipated DC Plan:  [place]  In-house referral  NA      DC Planning Services  CM consult      [facility] Choice  NA   Choice offered to / List presented to:  NA      DME agency  NA        [facility] agency  NA   Status of service:  In process, will continue to follow Medicare Important Message given?   (If response is "NO", the following Medicare IM given date fields will be blank) Date Medicare IM given:   Medicare IM given by:   Date Additional Medicare IM given:   Additional Medicare IM given by:    Discharge Disposition:    Per UR Regulation:  Reviewed for med. necessity/level of care/duration of stay  If discussed at [place] of Stay Meetings, dates discussed:    Comments:  August 13, 2014/Rhonda L. Rosana Hoes, RN, BSN, CCM. Case Management [place] [phone] No discharge needs present of time of review.

## 2014-08-13 NOTE — Progress Notes (Signed)
Pt is stable at transfer. Report given.

## 2014-08-13 NOTE — Progress Notes (Signed)
Spoke to MD on call with Urology around 0100 on 08/13/14 concerning BPs running around 412 systolic. Advised to keep pain controlled.  No orders added.

## 2014-08-13 NOTE — Evaluation (Signed)
Physical Therapy Evaluation Patient Details Name: Kathleen Schroeder MRN: [id number] DOB: 1930-07-05 Today's Date: 08/13/2014   History of Present Illness  81 yo female s/p right radical nephrectomy 4/25  Clinical Impression  On eval, pt required Min assist +2 for mobility-able to ambulate ~15 feet with RW. Limited by weakness/fatigue, pain, nausea. Family present during session.     Follow Up Recommendations Home health PT;Supervision/Assistance - 24 hour (as long as family can provide supervision/assist. )    Equipment Recommendations  None recommended by PT    Recommendations for Other Services OT consult     Precautions / Restrictions Precautions Precautions: Fall Restrictions Weight Bearing Restrictions: No      Mobility  Bed Mobility               General bed mobility comments: oob in recliner  Transfers Overall transfer level: Needs assistance Equipment used: Rolling walker (2 wheeled) Transfers: Sit to/from Stand Sit to Stand: Min assist         General transfer comment: Assist to rise, stabilize, control descent. VCS safety, hand placement  Ambulation/Gait Ambulation/Gait assistance: Min assist;+2 safety/equipment Ambulation Distance (Feet): 15 Feet Assistive device: Rolling walker (2 wheeled) Gait Pattern/deviations: Step-through pattern;Decreased stride length;Trunk flexed     General Gait Details: very slow gait speed. fatigues easily. followed with recliner. VCs distance from RW and encouragment to progress distance. Pt c/o nausea immediately after sitting in recliner.   Stairs            Wheelchair Mobility    Modified Rankin (Stroke Patients Only)       Balance                                             Pertinent Vitals/Pain Pain Assessment: Faces Faces Pain Scale: Hurts little more Pain Location: abdomen Pain Descriptors / Indicators: Sore Pain Intervention(s): Monitored during session;Limited activity  within patient's tolerance    Home Living Family/patient expects to be discharged to:: Private residence Living Arrangements: Spouse/significant other Available Help at Discharge: Family Type of Home: House Home Access: Stairs to enter Entrance Stairs-Rails:  (yes) Entrance Stairs-Number of Steps: 4 Home Layout: One level Home Equipment: Environmental consultant - 2 wheels;Bedside commode;Shower seat      Prior Function Level of Independence: Independent with assistive device(s)               Hand Dominance        Extremity/Trunk Assessment   Upper Extremity Assessment: Defer to OT evaluation           Lower Extremity Assessment: Generalized weakness      Cervical / Trunk Assessment: Kyphotic  Communication      Cognition Arousal/Alertness: Awake/alert Behavior During Therapy: WFL for tasks assessed/performed Overall Cognitive Status: Within Functional Limits for tasks assessed                      General Comments      Exercises        Assessment/Plan    PT Assessment Patient needs continued PT services  PT Diagnosis Difficulty walking;Generalized weakness;Acute pain   PT Problem List Decreased strength;Decreased activity tolerance;Decreased balance;Decreased mobility;Decreased knowledge of use of DME;Pain  PT Treatment Interventions DME instruction;Functional mobility training;Gait training;Therapeutic activities;Therapeutic exercise;Patient/family education;Balance training   PT Goals (Current goals can be found in the Care Plan section) Acute Rehab PT  Goals Patient Stated Goal: less pain. home soon. PT Goal Formulation: With patient/family Time For Goal Achievement: 08/27/14 Potential to Achieve Goals: Good    Frequency Min 3X/week   Barriers to discharge        Co-evaluation               End of Session   Activity Tolerance: Patient limited by fatigue;Patient limited by pain Patient left: in chair;with call bell/phone within reach;with  family/visitor present           Time: 1203-1222 PT Time Calculation (min) (ACUTE ONLY): 19 min   Charges:   PT Evaluation $Initial PT Evaluation Tier I: 1 Procedure     PT G Codes:        Weston Anna, MPT Pager: [phone]

## 2014-08-13 NOTE — Progress Notes (Signed)
1 Day Post-Op Subjective: Complaint of persistent nausea. Not associated with medications. Does not sounds like reflux. Vomited small amount while in room examining. Up in chair and ambulated outside of room today. Pain controlled. Drank supplement shake.  Objective: Vital signs in last 24 hours: Temp:  [97.3 F (36.3 C)-98.5 F (36.9 C)] 97.8 F (36.6 C) (04/26 1210) Pulse Rate:  [71-92] 71 (04/26 1400) Resp:  [14-23] 16 (04/26 1400) BP: (127-193)/(53-100) 131/66 mmHg (04/26 1400) SpO2:  [92 %-100 %] 92 % (04/26 1400)  Intake/Output from previous day: 04/25 0701 - 04/26 0700 In: 4125 [P.O.:200; I.V.:3575; IV Piggyback:350] Out: 2150 [Urine:1950; Blood:200] Intake/Output this shift: Total I/O In: 620 [I.V.:520; IV Piggyback:100] Out: 750 [Urine:750]  Physical Exam:  General: Alert and oriented CV: RRR Lungs: Clear Abdomen: Soft, ND Incisions: c/d/i Foley: clear yellow Ext: NT, No erythema  Lab Results:  Recent Labs  08/12/14 1805 08/13/14 0401  HGB 10.1* 10.1*  HCT 31.1* 32.4*   BMET  Recent Labs  08/13/14 0401  NA 125*  K 3.9  CL 93*  CO2 28  GLUCOSE 257*  BUN 10  CREATININE 0.88  CALCIUM 8.3*     Studies/Results: No results found.  Assessment/Plan: POD#1 right lap nx for large renal mass. Doing well.  -- Continue current pain regimen -- Continue gentle IVF -- Diet as tolerated with protein shakes -- OOB/ambulate/IS -- D/C foley tomorrow -- Follow labs -- PT/OT consult -- Move to floor    LOS: 1 day   Amalie Koran C 08/13/2014, 4:43 PM

## 2014-08-13 NOTE — Evaluation (Signed)
Occupational Therapy Evaluation Patient Details Name: Kathleen Schroeder MRN: [id number] DOB: 02/11/1931 Today's Date: 08/13/2014    History of Present Illness 81 yo female s/p right radical nephrectomy 4/25   Clinical Impression   Pt was admitted for the above surgery.  She will benefit from skilled OT to increase safety and independence with adls and bathroom transfers.  Pt was mod I prior to admission; goals in acute are for supervision to min A    Follow Up Recommendations  Home health OT;Supervision/Assistance - 24 hour    Equipment Recommendations  None recommended by OT    Recommendations for Other Services       Precautions / Restrictions Precautions Precautions: Fall Restrictions Weight Bearing Restrictions: No      Mobility Bed Mobility               General bed mobility comments: oob in recliner  Transfers Overall transfer level: Needs assistance Equipment used: Rolling walker (2 wheeled) Transfers: Sit to/from Stand Sit to Stand: Min assist         General transfer comment: Assist to rise, stabilize, control descent. VCS safety, hand placement    Balance                                            ADL Overall ADL's : Needs assistance/impaired     Grooming: Set up;Sitting   Upper Body Bathing: Minimal assitance;Sitting (lines)   Lower Body Bathing: Minimal assistance;Sit to/from stand   Upper Body Dressing : Minimal assistance;Sitting (lines)   Lower Body Dressing: Moderate assistance;Sit to/from stand   Toilet Transfer: Minimal assistance;+2 for safety/equipment;RW   Toileting- Clothing Manipulation and Hygiene: Minimal assistance;Sit to/from stand         General ADL Comments: Pt fatiques quickly.  Husband and family are available to assist her as needed.       Vision     Perception     Praxis      Pertinent Vitals/Pain Pain Assessment: Faces Faces Pain Scale: Hurts little more Pain Location:  abdomen Pain Descriptors / Indicators: Sore Pain Intervention(s): Monitored during session;Limited activity within patient's tolerance     Hand Dominance     Extremity/Trunk Assessment Upper Extremity Assessment Upper Extremity Assessment: Generalized weakness      Cervical / Trunk Assessment Cervical / Trunk Assessment: Kyphotic   Communication Communication Communication: No difficulties   Cognition Arousal/Alertness: Awake/alert Behavior During Therapy: WFL for tasks assessed/performed Overall Cognitive Status: Within Functional Limits for tasks assessed                     General Comments       Exercises       Shoulder Instructions      Home Living Family/patient expects to be discharged to:: Private residence Living Arrangements: Spouse/significant other Available Help at Discharge: Family Type of Home: House Home Access: Stairs to enter Technical brewer of Steps: 4 Entrance Stairs-Rails:  (yes) Home Layout: One level     Bathroom Shower/Tub: Occupational psychologist: Handicapped height     Home Equipment: Bedside commode;Shower seat;Walker - 4 wheels          Prior Functioning/Environment Level of Independence: Independent with assistive device(s)             OT Diagnosis: Generalized weakness;Acute pain   OT Problem List: Decreased strength;Decreased  activity tolerance;Pain;Impaired balance (sitting and/or standing)   OT Treatment/Interventions: Self-care/ADL training;DME and/or AE instruction;Patient/family education;Balance training    OT Goals(Current goals can be found in the care plan section) Acute Rehab OT Goals Patient Stated Goal: less pain. home soon. OT Goal Formulation: With patient Time For Goal Achievement: 08/27/14 Potential to Achieve Goals: Good ADL Goals Pt Will Perform Grooming: with supervision;standing Pt Will Transfer to Toilet: with supervision;bedside commode;ambulating;regular height  toilet (vs) Pt Will Perform Tub/Shower Transfer: Shower transfer;ambulating;shower seat;with min guard assist  OT Frequency: Min 2X/week   Barriers to D/C:            Co-evaluation PT/OT/SLP Co-Evaluation/Treatment: Yes Reason for Co-Treatment: For patient/therapist safety PT goals addressed during session: Mobility/safety with mobility OT goals addressed during session: ADL's and self-care      End of Session Nurse Communication:  (nausea, drop in BP when standing)  Activity Tolerance: Patient limited by fatigue Patient left: in chair;with call bell/phone within reach;with family/visitor present   Time: 1203-1222 OT Time Calculation (min): 19 min Charges:  OT General Charges $OT Visit: 1 Procedure OT Evaluation $Initial OT Evaluation Tier I: 1 Procedure G-Codes:    Kaiyan Luczak 08/27/14, 1:03 PM  Lesle Chris, OTR/L [phone] Aug 27, 2014

## 2014-08-13 NOTE — Progress Notes (Addendum)
1 Day Post-Op Subjective: Doing well. Complaining of "feeling sick" from generalized nausea. No emesis, better with zofran. Pain controlled. Tolerating clears. Has not been OOB.  Objective: Vital signs in last 24 hours: Temp:  [97.4 F (36.3 C)-98.5 F (36.9 C)] 97.5 F (36.4 C) (04/26 0000) Pulse Rate:  [79-92] 80 (04/26 0300) Resp:  [15-23] 15 (04/26 0300) BP: (112-193)/(53-96) 160/73 mmHg (04/26 0200) SpO2:  [96 %-100 %] 99 % (04/26 0300) Weight:  [150 lb (68.04 kg)] 150 lb (68.04 kg) (04/25 1015)  Intake/Output from previous day: 04/25 0701 - 04/26 0700 In: 4125 [P.O.:200; I.V.:3575; IV Piggyback:350] Out: 2150 [Urine:1950; Blood:200] Intake/Output this shift: Total I/O In: 8466 [P.O.:200; I.V.:1075; IV Piggyback:100] Out: 5993 [Urine:1550]  Physical Exam:  General: Alert and oriented CV: RRR Lungs: Clear Abdomen: Soft, ND Incisions: c/d/i Foley: clear yellow Ext: NT, No erythema  Lab Results:  Recent Labs  08/12/14 1805 08/13/14 0401  HGB 10.1* 10.1*  HCT 31.1* 32.4*   BMET  Recent Labs  08/13/14 0401  NA 125*  K 3.9  CL 93*  CO2 28  GLUCOSE 257*  BUN 10  CREATININE 0.88  CALCIUM 8.3*     Studies/Results: No results found.  Assessment/Plan: POD#1 right lap nx for large renal mass. Doing well.  -- Continue current pain regimen -- Continue gentle IVF -- Continue CLD -- OOB/ambulate/IS -- D/C foley -- Follow labs -- PT/OT consult -- Move to floor    LOS: 1 day   JOHNSON, DAVID C 08/13/2014, 6:53 AM     I performed a history and physical examination of the patient and discussed his management with the resident.  I reviewed the resident's note and agree with the documented findings and plan of care. OOB, ambulate ADAT - protein shakes b/w meals Transfer to 4th floor. D/c foley tomorrow

## 2014-08-14 ENCOUNTER — Encounter (HOSPITAL_COMMUNITY): Payer: Self-pay | Admitting: Urology

## 2014-08-14 LAB — BASIC METABOLIC PANEL
Anion gap: 9 (ref 5–15)
BUN: 20 mg/dL (ref 6–23)
CALCIUM: 8.8 mg/dL (ref 8.4–10.5)
CO2: 28 mmol/L (ref 19–32)
CREATININE: 1.05 mg/dL (ref 0.50–1.10)
Chloride: 90 mmol/L — ABNORMAL LOW (ref 96–112)
GFR calc Af Amer: 56 mL/min — ABNORMAL LOW (ref >90)
GFR calc non Af Amer: 48 mL/min — ABNORMAL LOW (ref >90)
Glucose, Bld: 151 mg/dL — ABNORMAL HIGH (ref 70–99)
Potassium: 3.9 mmol/L (ref 3.5–5.1)
Sodium: 127 mmol/L — ABNORMAL LOW (ref 135–145)

## 2014-08-14 LAB — GLUCOSE, CAPILLARY
GLUCOSE-CAPILLARY: 150 mg/dL — AB (ref 70–99)
GLUCOSE-CAPILLARY: 107 mg/dL — AB (ref 70–99)
Glucose-Capillary: 162 mg/dL — ABNORMAL HIGH (ref 70–99)
Glucose-Capillary: 138 mg/dL — ABNORMAL HIGH (ref 70–99)
Glucose-Capillary: 89 mg/dL (ref 70–99)

## 2014-08-14 LAB — CBC
HCT: 31.1 % — ABNORMAL LOW (ref 36.0–46.0)
Hemoglobin: 9.9 g/dL — ABNORMAL LOW (ref 12.0–15.0)
MCH: 25.6 pg — AB (ref 26.0–34.0)
MCHC: 31.8 g/dL (ref 30.0–36.0)
MCV: 80.6 fL (ref 78.0–100.0)
Platelets: 369 10*3/uL (ref 150–400)
RBC: 3.86 MIL/uL — ABNORMAL LOW (ref 3.87–5.11)
RDW: 14.3 % (ref 11.5–15.5)
WBC: 12.6 10*3/uL — AB (ref 4.0–10.5)

## 2014-08-14 MED ORDER — TRAMADOL HCL 50 MG PO TABS
50.0000 mg | ORAL_TABLET | Freq: Four times a day (QID) | ORAL | Status: DC | PRN
  Administered 2014-08-15 (×2): 50 mg via ORAL
  Administered 2014-08-16: 100 mg via ORAL
  Filled 2014-08-14: qty 1
  Filled 2014-08-14: qty 2
  Filled 2014-08-14: qty 1

## 2014-08-14 MED ORDER — ACETAMINOPHEN 10 MG/ML IV SOLN
1000.0000 mg | Freq: Four times a day (QID) | INTRAVENOUS | Status: AC
  Administered 2014-08-14 – 2014-08-15 (×4): 1000 mg via INTRAVENOUS
  Filled 2014-08-14 (×4): qty 100

## 2014-08-14 NOTE — Progress Notes (Signed)
2 Days Post-Op Subjective: Mildly confused this afternoon. Vomited this morning but no reports of ongoing nausea. No pain. Ambulating more with the assistance of a walker. Tolerating PO intake. Foley removed and initially had difficulty urinating but has seen been able to do so.  Objective: Vital signs in last 24 hours: Temp:  [97.5 F (36.4 C)-98.1 F (36.7 C)] 98.1 F (36.7 C) (04/27 1446) Pulse Rate:  [72-80] 72 (04/27 1446) Resp:  [16-18] 18 (04/27 1446) BP: (122-153)/(60-75) 140/75 mmHg (04/27 1446) SpO2:  [92 %-95 %] 95 % (04/27 1446) Weight:  [156 lb 4.9 oz (70.9 kg)] 156 lb 4.9 oz (70.9 kg) (04/26 1719)  Intake/Output from previous day: 04/26 0701 - 04/27 0700 In: 1930 [P.O.:360; I.V.:1270; IV Piggyback:300] Out: 1101 [Urine:1100; Emesis/NG output:1] Intake/Output this shift: Total I/O In: 890 [P.O.:240; I.V.:450; IV Piggyback:200] Out: 275 [Urine:275]  Physical Exam:  General: Alert and oriented CV: RRR Lungs: Clear Abdomen: Soft, ND Incisions: c/d/i Ext: NT, No erythema  Lab Results:  Recent Labs  08/12/14 1805 08/13/14 0401 08/14/14 0537  HGB 10.1* 10.1* 9.9*  HCT 31.1* 32.4* 31.1*   BMET  Recent Labs  08/13/14 0401 08/14/14 0537  NA 125* 127*  K 3.9 3.9  CL 93* 90*  CO2 28 28  GLUCOSE 257* 151*  BUN 10 20  CREATININE 0.88 1.05  CALCIUM 8.3* 8.8     Studies/Results: No results found.  Assessment/Plan: POD#2 right lap nx for large renal mass. Doing well.  -- Continue current pain regimen -- [place] as drinking well today. Follow UOP. -- Diet as tolerated with protein shakes -- OOB/ambulate/IS -- Follow UOP -- PT/OT consult for dispo, likely to SNF.    LOS: 2 days   Wynetta Emery, Marixa Mellott C 08/14/2014, 4:19 PM

## 2014-08-14 NOTE — Progress Notes (Addendum)
Bladder scan performed 370 ml. Abdomen is distended, but soft. Pt c/o of pressure in bladder. Urology on call notified. Per Dr. Wynetta Emery to insert foley catheter if no urine output in 1 hr. Will continue to monitor closely.  0050: Patient voided 150 cc's. Bladder scan post void showed 408 cc's. Urology MD [facility] notified. Per MD to leave foley out and Urology will assess patient in the morning. Pt stated abdominal pressure slightly better after void. Will continue to monitor patient closely.

## 2014-08-14 NOTE — Clinical Social Work Note (Signed)
Clinical Social Work Assessment  Patient Details  Name: Kathleen Schroeder MRN: [id number] Date of Birth: 03/06/1931  Date of referral:  08/14/14               Reason for consult:  Facility Placement                Permission sought to share information with:  Chartered certified accountant granted to share information::  Yes, Verbal Permission Granted  Name::        Agency::     Relationship::     Contact Information:     Housing/Transportation Living arrangements for the past 2 months:  Single Family Home Source of Information:  Patient, Spouse Patient Interpreter Needed:  None Criminal Activity/Legal Involvement Pertinent to Current Situation/Hospitalization:    Significant Relationships:  Adult Children, Spouse Lives with:  Spouse Do you feel safe going back to the place where you live?  No Need for family participation in patient care:  Yes (Comment)  Care giving concerns:  CSW received consult from RN that patient may need SNF at discharge.    Social Worker assessment / plan:  CSW met with patient & husband at bedside and son, [name] via phone to confirm that they are agreeable with plan for SNF at discharge. Son is requesting [facility], CSW awaiting call back re: bed offer.   Employment status:  Retired Nurse, adult PT Recommendations:  [place] / Referral to community resources:  [place]  Patient/Family's Response to care:  Patient is agreeable with plan for SNF, states that she understands that she would not be able to return home in her weakened condition.   Patient/Family's Understanding of and Emotional Response to Diagnosis, Current Treatment, and Prognosis:  Patient states that she feels as though she's doing better with each day and is hopeful for a quick rehab stay so she could return home to her husband of 62 years.   Emotional Assessment Appearance:  Appears stated  age Attitude/Demeanor/Rapport:    Affect (typically observed):  Calm, Pleasant Orientation:  Oriented to Self, Oriented to Place, Oriented to  Time, Oriented to Situation Alcohol / Substance use:    Psych involvement (Current and /or in the community):  No (Comment)  Discharge Needs  Concerns to be addressed:  Discharge Planning Concerns Readmission within the last 30 days:    Current discharge risk:    Barriers to Discharge:      Standley Brooking, LCSW 08/14/2014, 3:31 PM

## 2014-08-14 NOTE — Clinical Social Work Placement (Signed)
CSW awaiting call back from [facility] SNF re: bed offer as that is the patient/family's first choice. CSW obtained [employer] authorization.   Raynaldo Opitz, [facility] Clinical Social Worker cell #: [phone]   CLINICAL SOCIAL WORK PLACEMENT  NOTE  Date:  08/14/2014  Patient Details  Name: Kathleen Schroeder MRN: [id number] Date of Birth: 09/15/1930  Clinical Social Work is seeking post-discharge placement for this patient at the [place] level of care (*CSW will initial, date and re-position this form in  chart as items are completed):  Yes   Patient/family provided with [facility] Work Department's list of facilities offering this level of care within the geographic area requested by the patient (or if unable, by the patient's family).  Yes   Patient/family informed of their freedom to choose among providers that offer the needed level of care, that participate in Medicare, Medicaid or managed care program needed by the patient, have an available bed and are willing to accept the patient.  Yes   Patient/family informed of [employer]'s ownership interest in [facility] and [facility], as well as of the fact that they are under no obligation to receive care at these facilities.  PASRR submitted to EDS on 08/14/14     PASRR number received on 08/14/14     Existing PASRR number confirmed on       FL2 transmitted to all facilities in geographic area requested by pt/family on 08/14/14     FL2 transmitted to all facilities within larger geographic area on       Patient informed that his/her managed care company has contracts with or will negotiate with certain facilities, including the following:        Yes   Patient/family informed of bed offers received.  Patient chooses bed at       Physician recommends and patient chooses bed at      Patient to be transferred to   on  .  Patient to be  transferred to facility by       Patient family notified on   of transfer.  Name of family member notified:        PHYSICIAN       Additional Comment:    _______________________________________________ Standley Brooking, LCSW 08/14/2014, 4:15 PM

## 2014-08-14 NOTE — Progress Notes (Signed)
2 Days Post-Op Subjective: Much better this am. Minimal nausea. Minimal pain. Passing gas, no BM, and tolerating clears. Ambulated with assistance and was OOB to chair yesterday.  Objective: Vital signs in last 24 hours: Temp:  [97.4 F (36.3 C)-97.9 F (36.6 C)] 97.7 F (36.5 C) (04/27 0525) Pulse Rate:  [71-81] 80 (04/27 0525) Resp:  [16-18] 16 (04/27 0525) BP: (122-166)/(60-100) 153/73 mmHg (04/27 0525) SpO2:  [92 %-98 %] 92 % (04/27 0525) Weight:  [156 lb 4.9 oz (70.9 kg)] 156 lb 4.9 oz (70.9 kg) (04/26 1719)  Intake/Output from previous day: 04/26 0701 - 04/27 0700 In: 1930 [P.O.:360; I.V.:1270; IV Piggyback:300] Out: 1101 [Urine:1100; Emesis/NG output:1] Intake/Output this shift: Total I/O In: 1310 [P.O.:360; I.V.:750; IV Piggyback:200] Out: 350 [Urine:350]  Physical Exam:  General: Alert and oriented CV: RRR Lungs: Clear Abdomen: Soft, ND Incisions: c/d/i Foley: clear yellow Ext: NT, No erythema  Lab Results:  Recent Labs  08/12/14 1805 08/13/14 0401 08/14/14 0537  HGB 10.1* 10.1* 9.9*  HCT 31.1* 32.4* 31.1*   BMET  Recent Labs  08/13/14 0401  NA 125*  K 3.9  CL 93*  CO2 28  GLUCOSE 257*  BUN 10  CREATININE 0.88  CALCIUM 8.3*     Studies/Results: No results found.  Assessment/Plan: POD#2 right lap nx for large renal mass. Doing well.  -- Continue current pain regimen -- Medlock as drinking well today. Follow UOP. -- Diet as tolerated with protein shakes -- OOB/ambulate/IS -- D/C foley -- Follow labs -- PT/OT consult for dispo. PT/OT recommending home health with 24 hour supervision, however family reports that they are unable to provide 24 hour care due to work obligations. Anticipated D/C 4/29.    LOS: 2 days   Wynetta Emery, Mallerie Blok C 08/14/2014, 6:41 AM

## 2014-08-15 LAB — GLUCOSE, CAPILLARY
GLUCOSE-CAPILLARY: 70 mg/dL (ref 70–99)
GLUCOSE-CAPILLARY: 120 mg/dL — AB (ref 70–99)
Glucose-Capillary: 108 mg/dL — ABNORMAL HIGH (ref 70–99)
Glucose-Capillary: 110 mg/dL — ABNORMAL HIGH (ref 70–99)
Glucose-Capillary: 153 mg/dL — ABNORMAL HIGH (ref 70–99)
Glucose-Capillary: 171 mg/dL — ABNORMAL HIGH (ref 70–99)
Glucose-Capillary: 53 mg/dL — ABNORMAL LOW (ref 70–99)
Glucose-Capillary: 70 mg/dL (ref 70–99)
Glucose-Capillary: 81 mg/dL (ref 70–99)
Glucose-Capillary: 84 mg/dL (ref 70–99)

## 2014-08-15 LAB — CBC
HEMATOCRIT: 31.2 % — AB (ref 36.0–46.0)
Hemoglobin: 9.9 g/dL — ABNORMAL LOW (ref 12.0–15.0)
MCH: 25.6 pg — ABNORMAL LOW (ref 26.0–34.0)
MCHC: 31.7 g/dL (ref 30.0–36.0)
MCV: 80.6 fL (ref 78.0–100.0)
Platelets: 373 10*3/uL (ref 150–400)
RBC: 3.87 MIL/uL (ref 3.87–5.11)
RDW: 14.4 % (ref 11.5–15.5)
WBC: 13.8 10*3/uL — ABNORMAL HIGH (ref 4.0–10.5)

## 2014-08-15 LAB — BASIC METABOLIC PANEL
ANION GAP: 10 (ref 5–15)
BUN: 27 mg/dL — ABNORMAL HIGH (ref 6–23)
CO2: 24 mmol/L (ref 19–32)
Calcium: 8.3 mg/dL — ABNORMAL LOW (ref 8.4–10.5)
Chloride: 89 mmol/L — ABNORMAL LOW (ref 96–112)
Creatinine, Ser: 1.18 mg/dL — ABNORMAL HIGH (ref 0.50–1.10)
GFR, EST AFRICAN AMERICAN: 49 mL/min — AB (ref >90)
GFR, EST NON AFRICAN AMERICAN: 42 mL/min — AB (ref >90)
Glucose, Bld: 71 mg/dL (ref 70–99)
POTASSIUM: 3.8 mmol/L (ref 3.5–5.1)
SODIUM: 123 mmol/L — AB (ref 135–145)

## 2014-08-15 MED ORDER — SODIUM CHLORIDE 0.9 % IV SOLN
INTRAVENOUS | Status: DC
  Administered 2014-08-15: 07:00:00 via INTRAVENOUS

## 2014-08-15 NOTE — Progress Notes (Signed)
Hypoglycemic Event  CBG: 53  Treatment: 15 GM carbohydrate snack  Symptoms: None  Follow-up CBG: Time: 0115 CBG Result:70  Possible Reasons for Event: Inadequate meal intake  Comments/MD notified:David Johnson. No new orders     Chasitty Hehl Y  Remember to initiate Hypoglycemia Order Set & complete

## 2014-08-15 NOTE — Progress Notes (Addendum)
3 Days Post-Op Subjective: Pain controlled. Had difficulty urinating yesterday and had to be I&O cathed. Overnight, was able to void 153mL with 450mL residual. She then voided another 243mL. Overall feels much better and is mentating more clearly this am.  Objective: Vital signs in last 24 hours: Temp:  [97.8 F (36.6 C)-98.1 F (36.7 C)] 97.8 F (36.6 C) (04/28 0426) Pulse Rate:  [72-76] 72 (04/28 0426) Resp:  [18] 18 (04/28 0426) BP: (137-155)/(60-75) 137/60 mmHg (04/28 0426) SpO2:  [95 %-97 %] 97 % (04/28 0426)  Intake/Output from previous day: 04/27 0701 - 04/28 0700 In: 1990 [P.O.:840; I.V.:850; IV Piggyback:300] Out: 625 [Urine:625] Intake/Output this shift: Total I/O In: 780 [P.O.:480; I.V.:200; IV Piggyback:100] Out: 350 [Urine:350]  Physical Exam:  General: Alert and oriented CV: RRR Lungs: Clear Abdomen: Soft, ND Incisions: c/d/i Ext: NT, No erythema  Lab Results: CBC Latest Ref Rng 08/15/2014 08/14/2014 08/13/2014  WBC 4.0 - 10.5 K/uL 13.8(H) 12.6(H) 8.1  Hemoglobin 12.0 - 15.0 g/dL 9.9(L) 9.9(L) 10.1(L)  Hematocrit 36.0 - 46.0 % 31.2(L) 31.1(L) 32.4(L)  Platelets 150 - 400 K/uL 373 369 324      BMET  Recent Labs  08/14/14 0537 08/15/14 0432  NA 127* 123*  K 3.9 3.8  CL 90* 89*  CO2 28 24  GLUCOSE 151* 71  BUN 20 27*  CREATININE 1.05 1.18*  CALCIUM 8.8 8.3*     Studies/Results: No results found.  Assessment/Plan: POD#3 right lap nx for large renal mass. Doing well.  -- Continue current pain regimen -- Borderline UOP with slowly increasing Cr. Restart MIVF until UOP and PO intake picks up -- Diet as tolerated with protein shakes -- OOB/ambulate/IS -- PT/OT consult for dispo, likely to SNF.    LOS: 3 days   Wynetta Emery, DAVID C 08/15/2014, 6:12 AM   Addendum: Agree with above Poor PO intake - encourage ensure shakes Restart IVF  May be ready for SNF tomorrow, not ready today.

## 2014-08-15 NOTE — Progress Notes (Signed)
   08/15/14 1757  What Happened  Was fall witnessed? Yes  Who witnessed fall? Catie Beeson  Patients activity before fall bathroom-unassisted  Point of contact buttocks  Was patient injured? No  Follow Up  MD notified Dr. Louis Meckel  Time MD notified [place]  Family notified Yes-comment  Time family notified 1750  Additional tests No  Progress note created (see row info) Yes  Adult Fall Risk Assessment  Risk Factor Category (scoring not indicated) Fall has occurred during this admission (document High fall risk)  Patient's Fall Risk High Fall Risk (>13 points)  Adult Fall Risk Interventions  Required Bundle Interventions *See Row Information* High fall risk - low, moderate, and high requirements implemented  Additional Interventions Fall risk signage;Individualized elimination schedule;Secure all tubes/drains  Vitals  BP (!) 150/72 mmHg  BP Location Left Arm  BP Method Automatic  Patient Position (if appropriate) Lying  Pulse Rate 93  Pulse Rate Source Dinamap  Resp 18  Oxygen Therapy  SpO2 98 %  O2 Device Room Air  Pain Assessment  Pain Assessment No/denies pain  Pain Score 0  PCA/Epidural/Spinal Assessment  Respiratory Pattern Regular;Unlabored;Symmetrical  Neurological  Neuro (WDL) X  Level of Consciousness Alert  Orientation Level Oriented X4  Cognition Memory impairment;Poor safety awareness  Speech Clear  Neuro Symptoms Forgetful  Musculoskeletal  Musculoskeletal (WDL) X  Assistive Device Front wheel walker  Generalized Weakness Yes  Weight Bearing Restrictions No  Musculoskeletal Details  LLE Weakness  RLE Weakness  Integumentary  Integumentary (WDL) X  Skin Color Appropriate for ethnicity  Skin Condition Dry  Skin Integrity Surgical Incision (see LDA)  Skin Turgor Non-tenting   Pt was assisted to bathroom by tech. Pt was instructed to use call bell in restroom when she was finished. Pt had been doing this all day. I was called to help pt out of the  bathroom. I walked in and pt was sitting on trash can. Pt stated she felt very weak in her legs. The trash can became unstable and I gently guided her to sit on the floor. I stayed with pt until other staff arrived to help lift her off the bathroom floor. With assistance from other staff, we got the pt up. MD was at bedside. Family was at bedside. Vitals were stable. Pt did not complain of any pain. Pt only stated she felt generalized weakness. MD assessed and determined no further test were needed.  Will continue to monitor pt closely.

## 2014-08-15 NOTE — Progress Notes (Signed)
Occupational Therapy Treatment Patient Details Name: MATINA RODIER MRN: [id number] DOB: 12-12-1930 Today's Date: 08/15/2014    History of present illness 81 yo female s/p right radical nephrectomy 4/25   OT comments  Pt with gopd participation  Follow Up Recommendations  SNF;Home health OT;Supervision/Assistance - 24 hour    Equipment Recommendations  None recommended by OT    Recommendations for Other Services      Precautions / Restrictions Precautions Precautions: Fall Restrictions Weight Bearing Restrictions: No       Mobility Bed Mobility Overal bed mobility: Needs Assistance Bed Mobility: Supine to Sit     Supine to sit: Min assist        Transfers Overall transfer level: Needs assistance Equipment used: 1 person hand held assist Transfers: Sit to/from Stand Sit to Stand: Min assist         General transfer comment: VC for safety    Balance                                   ADL       Grooming: Set up;Sitting   Upper Body Bathing: Set up;Sitting   Lower Body Bathing: Sit to/from stand;Minimal assistance   Upper Body Dressing : Set up;Sitting   Lower Body Dressing: Moderate assistance;Sit to/from stand   Toilet Transfer: Minimal assistance;BSC   Toileting- Clothing Manipulation and Hygiene: Minimal assistance;Sit to/from stand                Vision                                          Pertinent Vitals/ Pain       Pain Assessment: 0-10 Pain Score: 3  Pain Location: abdomen Pain Descriptors / Indicators: Sore Pain Intervention(s): Limited activity within patient's tolerance  Home Living                                          Prior Functioning/Environment              Frequency Min 2X/week     Progress Toward Goals  OT Goals(current goals can now be found in the care plan section)  Progress towards OT goals: Progressing toward goals     Plan Discharge plan  remains appropriate;Discharge plan needs to be updated    Co-evaluation                 End of Session     Activity Tolerance Patient tolerated treatment well   Patient Left in chair   Nurse Communication Mobility status        Time: 8546-2703 OT Time Calculation (min): 18 min  Charges: OT General Charges $OT Visit: 1 Procedure OT Treatments $Self Care/Home Management : 8-22 mins  Norwood Quezada, Thereasa Parkin 08/15/2014, 9:43 AM

## 2014-08-15 NOTE — Progress Notes (Signed)
3 Days Post-Op Subjective: Doing better today. Minimal pain. Reports having vomiting but RN refutes that. According to the RN, she is doing quite well. Ambulating and voided x 3 today, though no volumes are charted. Had a small BM this afternoon.  Objective: Vital signs in last 24 hours: Temp:  [97.8 F (36.6 C)-98.6 F (37 C)] 98.6 F (37 C) (04/28 1404) Pulse Rate:  [72-76] 75 (04/28 1404) Resp:  [18-20] 20 (04/28 1404) BP: (111-155)/(60-75) 111/64 mmHg (04/28 1404) SpO2:  [95 %-97 %] 97 % (04/28 1404)  Intake/Output from previous day: 04/27 0701 - 04/28 0700 In: 2426.7 [P.O.:840; I.V.:1286.7; IV Piggyback:300] Out: 625 [Urine:625] Intake/Output this shift: Total I/O In: 800 [I.V.:800] Out: 350 [Urine:350]  Physical Exam:  General: Alert and oriented CV: RRR Lungs: Clear Abdomen: Soft, ND Incisions: c/d/i Ext: NT, No erythema  Lab Results:  Recent Labs  08/13/14 0401 08/14/14 0537 08/15/14 0432  HGB 10.1* 9.9* 9.9*  HCT 32.4* 31.1* 31.2*   BMET  Recent Labs  08/14/14 0537 08/15/14 0432  NA 127* 123*  K 3.9 3.8  CL 90* 89*  CO2 28 24  GLUCOSE 151* 71  BUN 20 27*  CREATININE 1.05 1.18*  CALCIUM 8.8 8.3*     Studies/Results: No results found.  Assessment/Plan: POD#3 right lap nx for large renal mass. Doing well.  -- Continue current pain regimen -- Continue fluids today. Follow UOP. -- Diet as tolerated with protein shakes -- OOB/ambulate/IS -- Follow UOP -- PT/OT consult for dispo, likely to SNF tomorrow.    LOS: 3 days   Wynetta Emery, Raul Torrance C 08/15/2014, 3:56 PM

## 2014-08-16 ENCOUNTER — Inpatient Hospital Stay
Admission: RE | Admit: 2014-08-16 | Discharge: 2014-08-27 | Disposition: A | Discharge status: Home / Self Care | Payer: Medicare Other | Source: Ambulatory Visit | Attending: Internal Medicine | Admitting: Internal Medicine

## 2014-08-16 LAB — CBC
HCT: 35 % — ABNORMAL LOW (ref 36.0–46.0)
Hemoglobin: 11 g/dL — ABNORMAL LOW (ref 12.0–15.0)
MCH: 25.4 pg — ABNORMAL LOW (ref 26.0–34.0)
MCHC: 31.4 g/dL (ref 30.0–36.0)
MCV: 80.8 fL (ref 78.0–100.0)
Platelets: 462 10*3/uL — ABNORMAL HIGH (ref 150–400)
RBC: 4.33 MIL/uL (ref 3.87–5.11)
RDW: 14.4 % (ref 11.5–15.5)
WBC: 11.4 10*3/uL — ABNORMAL HIGH (ref 4.0–10.5)

## 2014-08-16 LAB — BASIC METABOLIC PANEL
ANION GAP: 6 (ref 5–15)
BUN: 24 mg/dL — AB (ref 6–23)
CALCIUM: 8.2 mg/dL — AB (ref 8.4–10.5)
CHLORIDE: 94 mmol/L — AB (ref 96–112)
CO2: 26 mmol/L (ref 19–32)
Creatinine, Ser: 1.01 mg/dL (ref 0.50–1.10)
GFR calc Af Amer: 59 mL/min — ABNORMAL LOW (ref >90)
GFR calc non Af Amer: 51 mL/min — ABNORMAL LOW (ref >90)
Glucose, Bld: 94 mg/dL (ref 70–99)
POTASSIUM: 3.3 mmol/L — AB (ref 3.5–5.1)
Sodium: 126 mmol/L — ABNORMAL LOW (ref 135–145)

## 2014-08-16 LAB — GLUCOSE, CAPILLARY
GLUCOSE-CAPILLARY: 62 mg/dL — AB (ref 70–99)
GLUCOSE-CAPILLARY: 90 mg/dL (ref 70–99)
GLUCOSE-CAPILLARY: 241 mg/dL — AB (ref 70–99)
Glucose-Capillary: 42 mg/dL — CL (ref 70–99)
Glucose-Capillary: 172 mg/dL — ABNORMAL HIGH (ref 70–99)

## 2014-08-16 MED ORDER — TRAMADOL HCL 50 MG PO TABS: 50.0000 mg | ORAL_TABLET | Freq: Four times a day (QID) | ORAL | Status: DC | PRN

## 2014-08-16 MED ORDER — ONDANSETRON HCL 4 MG PO TABS: 4.0000 mg | ORAL_TABLET | Freq: Three times a day (TID) | ORAL | Status: DC | PRN

## 2014-08-16 MED ORDER — POTASSIUM CHLORIDE 20 MEQ PO PACK: 40.0000 meq | PACK | Freq: Two times a day (BID) | ORAL | Status: DC

## 2014-08-16 MED ORDER — DOCUSATE SODIUM 100 MG PO CAPS: 100.0000 mg | ORAL_CAPSULE | Freq: Two times a day (BID) | ORAL | Status: DC | PRN

## 2014-08-16 MED ORDER — POTASSIUM CHLORIDE CRYS ER 20 MEQ PO TBCR
40.0000 meq | EXTENDED_RELEASE_TABLET | Freq: Two times a day (BID) | ORAL | Status: DC
  Administered 2014-08-16: 40 meq via ORAL
  Filled 2014-08-16: qty 2

## 2014-08-16 NOTE — Progress Notes (Signed)
CBG- 42 Pt given 1 cup of OJ to increase CBG. Will recheck at 0500. Insulin held for now. Will continue to monitor.

## 2014-08-16 NOTE — Discharge Summary (Signed)
Date of admission: 08/12/2014  Date of discharge: 08/16/2014  Admission diagnosis: right renal mass  Discharge diagnosis:   1. clear cell carcinoma of right kidney, T3a, s/p laparoscopic radical nephrectomy.   2. Malnutrition and deconditioning  Secondary diagnoses:  Patient Active Problem List   Diagnosis Date Noted  . Renal cancer 08/12/2014  . Right kidney mass 05/30/2014  . Pulmonary nodules 05/30/2014  . HYPERLIPIDEMIA 12/01/2009  . Type 2 diabetes mellitus, controlled 09/01/2009  . Essential hypertension 12/16/2008  . ESOPHAGEAL STRICTURE 12/16/2008  . HEARTBURN 04/08/2008  . DYSPHAGIA UNSPECIFIED 04/08/2008  . DYSPHAGIA 04/08/2008    History and Physical: For full details, please see admission history and physical. Briefly, Kathleen Schroeder is a 81 y.o. year old patient with large right renal mass.  Underwent a laparoscopic right radical nephrectomy on  April 25th.  Hospital Course: Patient tolerated the procedure well.  She was then transferred to the step-down unit after an uneventful PACU stay.  Her hospital course was uncomplicated.  On postoperative day #1 she was transferred to the floor. Initially, she had some nausea and was slow to start eating. However, by the end of her stay she was eating some of each meal and supplementing this with Ensure protein shakes. She also had some difficult voiding initially after the catheter was removed, however, she was able to ultimately start voiding on her own and emptying her bladder completely. On POD#4  she had met discharge criteria: was eating a regular diet, was up and ambulating independently,  pain was well controlled, was voiding without a catheter, and was ready to for discharge.  She has been seen and evaluated by physical therapy who recommended that she be discharged to a skilled nursing facility for ongoing physical therapy needs. Notably, the patient did have one episode of weakness when ambulating back to the bed from the  bathroom on postoperative day #3. She gently slid down the wall and sat on the trash can and then was assisted to the floor. There were no injuries. The patient was then placed back in the bed and reevaluated. At that point, her vital signs were stable and there was no further workup obtained.   Laboratory values:   Recent Labs  08/14/14 0537 08/15/14 0432 08/16/14 0510  WBC 12.6* 13.8* 11.4*  HGB 9.9* 9.9* 11.0*  HCT 31.1* 31.2* 35.0*    Recent Labs  08/14/14 0537 08/15/14 0432 08/16/14 0510  NA 127* 123* 126*  K 3.9 3.8 3.3*  CL 90* 89* 94*  CO2 '28 24 26  ' GLUCOSE 151* 71 94  BUN 20 27* 24*  CREATININE 1.05 1.18* 1.01  CALCIUM 8.8 8.3* 8.2*   No results for input(s): LABPT, INR in the last 72 hours. No results for input(s): LABURIN in the last 72 hours. No results found for this or any previous visit.  Disposition: Skilled nursing facility  Discharge instruction: The patient was instructed to be ambulatory but told to refrain from heavy lifting, strenuous activity, or driving.   Discharge medications:   Medication List    TAKE these medications        ACCU-CHEK AVIVA PLUS test strip  Generic drug:  glucose blood  USE AS DIRECTED     albuterol 108 (90 BASE) MCG/ACT inhaler  Commonly known as:  PROVENTIL HFA;VENTOLIN HFA  Inhale 1-2 puffs into the lungs every 6 (six) hours as needed for wheezing or shortness of breath.     amLODipine 5 MG tablet  Commonly known as:  NORVASC  Take 1 tablet (5 mg total) by mouth daily.     bisoprolol-hydrochlorothiazide 5-6.25 MG per tablet  Commonly known as:  ZIAC  TAKE 1 TABLET EVERY MORNING FOR BLOOD PRESSURE     brimonidine 0.15 % ophthalmic solution  Commonly known as:  ALPHAGAN  Place 1 drop into both eyes 2 (two) times daily.     docusate sodium 100 MG capsule  Commonly known as:  COLACE  Take 1 capsule (100 mg total) by mouth 2 (two) times daily as needed (take to keep stool soft.).     dorzolamide-timolol  22.3-6.8 MG/ML ophthalmic solution  Commonly known as:  COSOPT  Place 1 drop into both eyes 2 (two) times daily.     freestyle lancets  USE AS DIRECTED     glimepiride 2 MG tablet  Commonly known as:  AMARYL  TAKE 2 TABLET IN THE MORNING WITH BREAKFAST     losartan 50 MG tablet  Commonly known as:  COZAAR  Take 1 tablet (50 mg total) by mouth daily.     metFORMIN 500 MG tablet  Commonly known as:  GLUCOPHAGE  TAKE (2) TABLETS TWICE DAILY (MORNING AND EVEN- ING).     NEXIUM 40 MG capsule  Generic drug:  esomeprazole  TAKE 1 CAPSULE IN THE MORNING BEFORE BREAKFAST     ondansetron 4 MG tablet  Commonly known as:  ZOFRAN  Take 1 tablet (4 mg total) by mouth every 8 (eight) hours as needed for nausea or vomiting.     simvastatin 20 MG tablet  Commonly known as:  ZOCOR  Take 1 tablet (20 mg total) by mouth daily.     traMADol 50 MG tablet  Commonly known as:  ULTRAM  Take 1-2 tablets (50-100 mg total) by mouth every 6 (six) hours as needed for moderate pain.        Followup:      Follow-up Information    Follow up with Ardis Hughs, MD On 08/26/2014.   Specialty:  Urology   Why:  10:45am   Contact information:   [place] [address] [phone]

## 2014-08-16 NOTE — Progress Notes (Signed)
Follow up CBG 62 (Apple juice given) Will follow up at 0500. Pt will also drink some Ensure. Will continue to monitor.

## 2014-08-16 NOTE — Progress Notes (Signed)
CBG at 90 after 1 cup of Apple Juice. Pt is now drinking Chocolate Ensure and pepsi. Only complaint is gas bubble from drinking sugary drinks. Will continue to monitor.

## 2014-08-16 NOTE — Clinical Social Work Placement (Signed)
Patient is set to discharge to [facility] SNF today. Patient, husband & son, [name] aware. Discharge packet given to RN, Lambert Keto. [facility] called for transport.     Raynaldo Opitz, [facility] Clinical Social Worker cell #: [phone]   CLINICAL SOCIAL WORK PLACEMENT  NOTE  Date:  08/16/2014  Patient Details  Name: Kathleen Schroeder MRN: [id number] Date of Birth: 04-Sep-1930  Clinical Social Work is seeking post-discharge placement for this patient at the [place] level of care (*CSW will initial, date and re-position this form in  chart as items are completed):  Yes   Patient/family provided with [place] Work Department's list of facilities offering this level of care within the geographic area requested by the patient (or if unable, by the patient's family).  Yes   Patient/family informed of their freedom to choose among providers that offer the needed level of care, that participate in Medicare, Medicaid or managed care program needed by the patient, have an available bed and are willing to accept the patient.  Yes   Patient/family informed of [facility]'s ownership interest in [facility] and [facility], as well as of the fact that they are under no obligation to receive care at these facilities.  PASRR submitted to EDS on 08/14/14     PASRR number received on 08/14/14     Existing PASRR number confirmed on       FL2 transmitted to all facilities in geographic area requested by pt/family on 08/14/14     FL2 transmitted to all facilities within larger geographic area on       Patient informed that his/her managed care company has contracts with or will negotiate with certain facilities, including the following:        Yes   Patient/family informed of bed offers received.  Patient chooses bed at [facility]     Physician recommends and patient chooses bed at      Patient to be transferred to  [facility] on 08/16/14.  Patient to be transferred to facility by [facility]     Patient family notified on 08/16/14 of transfer.  Name of family member notified:  patient's husband & son, [name] at bedside     PHYSICIAN       Additional Comment:    _______________________________________________ Standley Brooking, LCSW 08/16/2014, 11:55 AM

## 2014-08-16 NOTE — Progress Notes (Signed)
4 Days Post-Op Subjective: Doing better today. Voiding more. Tolerating adequate amount of liquidse. Had some low glucose measurements yesterday but improved with oral intake. Had a watery bowel movement yesterday.  Objective: Vital signs in last 24 hours: Temp:  [97.8 F (36.6 C)-98.6 F (37 C)] 97.8 F (36.6 C) (04/29 0411) Pulse Rate:  [75-93] 88 (04/29 0411) Resp:  [18-20] 18 (04/29 0411) BP: (111-150)/(58-72) 139/58 mmHg (04/29 0411) SpO2:  [93 %-98 %] 93 % (04/29 0411)  Intake/Output from previous day: 04/28 0701 - 04/29 0700 In: 2300 [I.V.:2300] Out: 1300 [Urine:1300] Intake/Output this shift: Total I/O In: 1100 [I.V.:1100] Out: 950 [Urine:950]  Physical Exam:  General: Alert and oriented CV: RRR Lungs: Clear Abdomen: Soft, ND Incisions: c/d/i Ext: NT, No erythema  Lab Results:  Recent Labs  08/14/14 0537 08/15/14 0432 08/16/14 0510  HGB 9.9* 9.9* 11.0*  HCT 31.1* 31.2* 35.0*   BMET  Recent Labs  08/15/14 0432 08/16/14 0510  NA 123* 126*  K 3.8 3.3*  CL 89* 94*  CO2 24 26  GLUCOSE 71 94  BUN 27* 24*  CREATININE 1.18* 1.01  CALCIUM 8.3* 8.2*     Studies/Results: No results found.  Assessment/Plan: POD#4 right lap nx for large renal mass. Doing well. Cr back down, Hgb stable  -- Continue current pain regimen -- Medlock -- Diet as tolerated with protein shakes -- OOB/ambulate/IS -- Follow UOP -- Will consider checking for c.diff if watery BMs continue -- PT/OT consult for dispo, likely to SNF today.    LOS: 4 days   Wynetta Emery, Bryer Cozzolino C 08/16/2014, 6:56 AM

## 2014-08-19 ENCOUNTER — Other Ambulatory Visit: Payer: Self-pay | Admitting: *Deleted

## 2014-08-19 ENCOUNTER — Encounter: Payer: Self-pay | Admitting: Internal Medicine

## 2014-08-19 ENCOUNTER — Non-Acute Institutional Stay (SKILLED_NURSING_FACILITY): Payer: Medicare Other | Admitting: Internal Medicine

## 2014-08-19 DIAGNOSIS — Z905 Acquired absence of kidney: Secondary | ICD-10-CM

## 2014-08-19 DIAGNOSIS — E871 Hypo-osmolality and hyponatremia: Secondary | ICD-10-CM

## 2014-08-19 DIAGNOSIS — C641 Malignant neoplasm of right kidney, except renal pelvis: Secondary | ICD-10-CM | POA: Diagnosis not present

## 2014-08-19 DIAGNOSIS — E119 Type 2 diabetes mellitus without complications: Secondary | ICD-10-CM

## 2014-08-19 MED ORDER — TRAMADOL HCL 50 MG PO TABS: ORAL_TABLET | ORAL | Status: DC

## 2014-08-19 NOTE — Telephone Encounter (Signed)
[facility] 

## 2014-08-19 NOTE — Telephone Encounter (Signed)
[facility] 

## 2014-08-20 NOTE — Progress Notes (Addendum)
Patient ID: Kathleen Schroeder, female   DOB: 1930-12-09, 81 y.o.   MRN: [id number]                 HISTORY & PHYSICAL  DATE:  08/19/2014       FACILITY: [facility]               LEVEL OF CARE:   SNF   CHIEF COMPLAINT:  Admission to SNF, post stay at [facility], 08/12/2014 through 08/16/2014.     HISTORY OF PRESENT ILLNESS:  This is an 81 year-old woman from [place] where she lives with her husband.    She was admitted to hospital for a laparoscopic right radical nephrectomy for malignancy.  Work-up of this has been going on for most of the year.  She had a CT scan of the abdomen and chest done in February.  The abdominal CT showed a large infiltrating right renal mass replacing the mid and lower pole with surrounding neovascularity and mass effect upon the right hepatic lobe and right paraspinal muscles, but no direct evidence for invasion.  CT scan of the chest showed multiple subcentimeter pulmonary nodules.  MRI of the abdomen in March showed a 7 mm nodule in the posterior right lower lobe.  The mass in the right lower pole of the kidney was again identified without obvious adenopathy.  The 7 mm right lobe lung nodule was suspicious for metastatic disease.    The patient underwent the procedure noted.  She has tolerated the surgery well and is here for further rehabilitation.    LABORATORY DATA:  Her discharge hemoglobin was 11.    Discharge basic metabolic panel showed a sodium of 126, potassium of 3.3, BUN of 24, and a creatinine 1.01.  Looking back on her records, her sodium got as low as 123 on 08/15/2014.    PAST MEDICAL HISTORY/PROBLEM LIST:                     Renal cancer.       Pulmonary nodules as described, possibly metastatic.    Hyperlipidemia.    Type 2 diabetes.  On oral agents.    History of an esophageal stricture.      CURRENT MEDICATIONS:  Discharge medications include:      Albuterol q.4 hours p.r.n.      Norvasc 5 q.d.      Ziac 5.6/6.25  daily.    Alphagan ophthalmic 1 drop into both eyes b.i.d.       Colace 100 b.i.d.      Cosopt ophthalmic b.i.d.       Amaryl 2 mg, 2 tablets/4 mg in the morning with breakfast.     Cozaar 50 q.d.       Metformin 1000 b.i.d.       Nexium 40 mg q.a.m.        Zofran 4 mg q.8 p.r.n.      Zocor 20 q.d.       Tramadol q.6 hours p.r.n.           SOCIAL HISTORY:                   HOUSING:  The patient lives at home with her husband.    FUNCTIONAL STATUS:  She has outside aides daily, although I am not exactly sure of the issue here.    REVIEW OF SYSTEMS:            CHEST/RESPIRATORY:  No  shortness of breath.    No cough.     CARDIAC:  No chest pain.    GI:  No clear dysphagia.  She is having some issues with constipation.    PHYSICAL EXAMINATION:   VITAL SIGNS:     PULSE:  78.             RESPIRATIONS:  18 and unlabored.   02 SATURATIONS:  98% on room air.     GENERAL APPEARANCE:  The patient looks well.  She was able to transfer herself from wheelchair to bed.   CHEST/RESPIRATORY:  She has a kyphosis.  There are bilateral crackles at the lower lobes.  No wheezing.         CARDIOVASCULAR:   CARDIAC:  Heart sounds are normal.  There are no murmurs.   GASTROINTESTINAL:   ABDOMEN:  Surgical scars are well approximated on the right.     LIVER/SPLEEN/KIDNEY:   No liver, no spleen are noted.   CIRCULATION:   EDEMA/VARICOSITIES:  Extremities:  No edema.  No evidence of a DVT.      ASSESSMENT/PLAN:                     Status post laparoscopic radical nephrectomy.   She appears to have tolerated this very well.  There are no findings.  Her incisions look stable.     Type 2 diabetes.  On oral agents.    No issues here.    History of gastroesophageal reflux and esophageal stricture.  I will continue her on her Nexium.    Hyponatremia and hypokalemia on discharge from the hospital at 126 and 3.3, respectively.  They are going to need to be rechecked.    The patient is already up  mobilizing.  Seems to be doing quite well.  She has help at home.   I am not totally sure of her functional status, although her husband is a reasonably healthy man.

## 2014-08-21 ENCOUNTER — Encounter (HOSPITAL_COMMUNITY)
Admission: RE | Admit: 2014-08-21 | Discharge: 2014-08-21 | Disposition: A | Discharge status: Home / Self Care | Payer: Medicare Other | Source: Skilled Nursing Facility | Attending: Internal Medicine | Admitting: Internal Medicine

## 2014-08-21 ENCOUNTER — Non-Acute Institutional Stay (SKILLED_NURSING_FACILITY): Payer: Medicare Other | Admitting: Internal Medicine

## 2014-08-21 DIAGNOSIS — Z905 Acquired absence of kidney: Secondary | ICD-10-CM | POA: Diagnosis not present

## 2014-08-21 DIAGNOSIS — E871 Hypo-osmolality and hyponatremia: Secondary | ICD-10-CM | POA: Diagnosis not present

## 2014-08-21 LAB — COMPREHENSIVE METABOLIC PANEL
ALBUMIN: 2.6 g/dL — AB (ref 3.5–5.0)
ALT: 17 U/L (ref 14–54)
ANION GAP: 9 (ref 5–15)
AST: 23 U/L (ref 15–41)
Alkaline Phosphatase: 65 U/L (ref 38–126)
BILIRUBIN TOTAL: 0.5 mg/dL (ref 0.3–1.2)
BUN: 14 mg/dL (ref 6–20)
CHLORIDE: 94 mmol/L — AB (ref 101–111)
CO2: 31 mmol/L (ref 22–32)
Calcium: 9.1 mg/dL (ref 8.9–10.3)
Creatinine, Ser: 0.89 mg/dL (ref 0.44–1.00)
GFR calc Af Amer: >60 mL/min (ref >60)
GFR calc non Af Amer: 59 mL/min — ABNORMAL LOW (ref >60)
Glucose, Bld: 138 mg/dL — ABNORMAL HIGH (ref 70–99)
Potassium: 3.6 mmol/L (ref 3.5–5.1)
Sodium: 134 mmol/L — ABNORMAL LOW (ref 135–145)
TOTAL PROTEIN: 6.7 g/dL (ref 6.5–8.1)

## 2014-08-23 NOTE — Progress Notes (Signed)
Patient ID: Kathleen Schroeder, female   DOB: 1930-07-28, 81 y.o.   MRN: [id number]                PROGRESS NOTE  DATE:  08/21/2014         FACILITY: [facility]             LEVEL OF CARE:   SNF   Acute Visit                        CHIEF COMPLAINT:  Follow up admission hyponatremia, diabetes.    HISTORY OF PRESENT ILLNESS:  This is a patient who underwent a laparoscopic nephrectomy for a large right renal mass.  This was on April 25th.  She came here for rehabilitation.    Left the hospital with a sodium of 126 and a potassium of 3.3.    She is a type 2 diabetic, on Amaryl 4 mg in the morning.  Her blood sugars seems to be well controlled in the low to mid 150s.        PHYSICAL EXAMINATION:   GENERAL APPEARANCE:  The patient looks well.     CARDIOVASCULAR:   CARDIAC:  Heart sounds are normal.  She does not appear to be dehydrated.      GASTROINTESTINAL:   ABDOMEN:  Soft, nontender.  No masses.    LABORATORY DATA:   Her sodium has come back at 134, potassium 3.6, BUN 14, creatinine 0.89.    Her albumin is 2.6.  Although this is a postoperative issue, I suspect this will improve, as well.    ASSESSMENT/PLAN:               Status post right laparoscopic nephrectomy.  She seems to be doing well.    Hyponatremia.  This is correcting, and I suspect this was probably excess free fluid in the hospital.   Hypokalemia.  This is also correcting.  She is not on potassium.  I will give her four doses.  She may have had a free fluid kaliuresis.     CPT CODE: 18841

## 2014-08-26 ENCOUNTER — Encounter (HOSPITAL_COMMUNITY)
Admission: RE | Admit: 2014-08-26 | Discharge: 2014-08-26 | Disposition: A | Discharge status: Home / Self Care | Payer: Medicare Other | Source: Skilled Nursing Facility | Attending: Internal Medicine | Admitting: Internal Medicine

## 2014-08-26 LAB — BASIC METABOLIC PANEL
ANION GAP: 8 (ref 5–15)
BUN: 18 mg/dL (ref 6–20)
CALCIUM: 9.5 mg/dL (ref 8.9–10.3)
CO2: 25 mmol/L (ref 22–32)
Chloride: 99 mmol/L — ABNORMAL LOW (ref 101–111)
Creatinine, Ser: 0.96 mg/dL (ref 0.44–1.00)
GFR calc non Af Amer: 54 mL/min — ABNORMAL LOW (ref >60)
Glucose, Bld: 112 mg/dL — ABNORMAL HIGH (ref 70–99)
Potassium: 3.9 mmol/L (ref 3.5–5.1)
Sodium: 132 mmol/L — ABNORMAL LOW (ref 135–145)

## 2014-08-27 ENCOUNTER — Non-Acute Institutional Stay (SKILLED_NURSING_FACILITY): Payer: Medicare Other | Admitting: Internal Medicine

## 2014-08-27 ENCOUNTER — Encounter: Payer: Self-pay | Admitting: Internal Medicine

## 2014-08-27 DIAGNOSIS — E119 Type 2 diabetes mellitus without complications: Secondary | ICD-10-CM | POA: Diagnosis not present

## 2014-08-27 DIAGNOSIS — E871 Hypo-osmolality and hyponatremia: Secondary | ICD-10-CM | POA: Insufficient documentation

## 2014-08-27 DIAGNOSIS — I10 Essential (primary) hypertension: Secondary | ICD-10-CM | POA: Diagnosis not present

## 2014-08-27 DIAGNOSIS — C649 Malignant neoplasm of unspecified kidney, except renal pelvis: Secondary | ICD-10-CM

## 2014-08-27 NOTE — Progress Notes (Signed)
Patient ID: Kathleen Schroeder, female   DOB: [birth year], 81 y.o.   MRN: [id number]           This is a discharge note            FACILITY: [facility]:   SNF   CHIEF COMPLAINT:  .   Discharge note  HISTORY OF PRESENT ILLNESS:  This is an 81 year-old woman from [place] where she lives with her husband.    She was admitted to hospital for a laparoscopic right radical nephrectomy for malignancy.  Work-up of this has been going on for most of the year.  She had a CT scan of the abdomen and chest done in February.  The abdominal CT showed a large infiltrating right renal mass replacing the mid and lower pole with surrounding neovascularity and mass effect upon the right hepatic lobe and right paraspinal muscles, but no direct evidence for invasion.  CT scan of the chest showed multiple subcentimeter pulmonary nodules.  MRI of the abdomen in March showed a 7 mm nodule in the posterior right lower lobe.  The mass in the right lower pole of the kidney was again identified without obvious adenopathy.  The 7 mm right lobe lung nodule was suspicious for metastatic disease.    The patient underwent the procedure noted.  She has tolerated the surgery well and was here for further rehabilitation. She has a very supportive husband who appears to be in relatively good health-she is largely ambulating in a wheelchair she does do transfers-she is looking forward very much to going home.  She has lost some weight appears her appetite is not very good here there is some suspicion when she gets home she will eat a bit better.  Vital signs remained stable she is a type II diabetic on Amaryl as well as Glucophage blood sugars appear to be unremarkable most recently in the 90s-low 100s.  She did see it appears her renal doctor yesterdayand thought to be doing well -she will need continued PT and OT for strengthening as well as RN support for multiple medical issues.  She does  have a history of hypokalemia and hyponatremia but this appears to have normalized on recent labs I suspect this will need to be rechecked in a few days-her sodium yesterday was 132 potassium was 3.9.  Currently she has no complaints        LABORATORY DATA:  Her discharge hemoglobin was 11.    Discharge basic metabolic panel showed a sodium of 126, potassium of 3.3, BUN of 24, and a creatinine 1.01.  Looking back on her records, her sodium got as low as 123 on 08/15/2014.    PAST MEDICAL HISTORY/PROBLEM LIST:                     Renal cancer.       Pulmonary nodules as described, possibly metastatic.    Hyperlipidemia.    Type 2 diabetes.  On oral agents.    History of an esophageal stricture.      CURRENT MEDICATIONS:  Discharge medications include:      Albuterol q.4 hours p.r.n.      Norvasc 5 q.d.      Ziac 5.6/6.25 daily.    Alphagan ophthalmic 1 drop into both eyes b.i.d.       Colace 100 b.i.d.  Cosopt ophthalmic b.i.d.       Amaryl 2 mg, 2 tablets/4 mg in the morning with breakfast.     Cozaar 50 q.d.       Metformin 1000 b.i.d.       Nexium 40 mg q.a.m.        Zofran 4 mg q.8 p.r.n.      Zocor 20 q.d.       Tramadol q.6 hours p.r.n.           SOCIAL HISTORY:                   HOUSING:  The patient lives at home with her husband.    FUNCTIONAL STATUS:  She has outside aides daily, although I am not exactly sure of the issue here.    REVIEW OF SYSTEMS:.  In general no complaints of fever or chills.  Skin does not complain of rashes or itching.  Head ears eyes nose mouth and throat does not complain of any sore throat or visual changes            CHEST/RESPIRATORY:  No shortness of breath.    No cough.     CARDIAC:  No chest pain.    GI:  No clear dysphagia. She is not complaining of constipation today or GI discomfort.  Musculoskeletal continues to have some weakness but does not complain of joint pain apparently any pain is controlled  with tramadol.  Neurologic does not complain of dizziness headache or syncopal-type feelings.  Psych-per nursing does have periods of confusion but this is not new.    PHYSICAL EXAMINATION:    Temperature 97.6 pulse 82 respirations 20 blood pressure 134/72-123/71 most recently.     GENERAL APPEARANCE:  The patient looks well.  Actually eating lunch today.  Her skin is warm and dry.  Eyes has prescription lenses pupils appear reactive light visual acuity appears grossly intact.  Oropharynx clear mucous membranes moist.  .   CHEST/RESPIRATORY:  She has a kyphosis. Clear to auscultation I did not appreciate any labored breathing       CARDIOVASCULAR:   CARDIAC:  Heart sounds are normal.  There are no murmurs. Regular rate and rhythm   GASTROINTESTINAL:   ABDOMEN:  Surgical scars are well approximated on the right. No noted drainage bleeding or sign of infection   abdomen is nontender soft with positive bowel sounds     CIRCULATION:   EDEMA/VARICOSITIES:  Extremities:  No edema.  No evidence of a DVT.   Musculoskeletal moves all extremities 47 ambulating in a wheelchair today who is capable of transfers I did not note any deformities strength appears to be intact all 4 extremities.  Neurologic is grossly intact no lateralizing findings her speech is clear.  Psych-she was oriented to self place month day of week-she was pleasant and appropriate per nursing she does have episodes of confusion  Labs.  08/26/2014.  Sodium 132 potassium 3.9 BUN 18 creatinine 0.96.  08/16/2014.  WBC 11.4 hemoglobin 11 platelets 462.  08/21/2014.  Liver function tests within normal limits except albumin of 2.6.       ASSESSMENT/PLAN:                     Status post laparoscopic radical nephrectomy.   She appears to have tolerated this very well.  .  Her incisions look stable-- will need follow-up here apparently doing well.     Type 2 diabetes.  On oral agents.  CBGs appears satisfactory  as noted above.    History of gastroesophageal reflux and esophageal stricture.continues on  prilosec--at one point she had been on Nexium I suspect this was switched in  facility secondary to protocol-however this will have to be reevaluated by primary care provider.    Hyponatremia and hypokalemia on discharge from the hospital at 126 and 3.3, respectively.  This has normalized with a sodium of 132 potassium 3.9 on lab done yesterday this will warrant updating later in the week by home health and notify primary care provider of results  She did receive potassium supplementation last week    Hyperlipidemia-since her stay here has been quite short--have not been aggressive pursuing a lipid panel liver function tests recently appeared within normal range except for an albumin of 2.6  Hypertension-this appears stable on Norvasc as well as Cozaar also Ziac--recent blood pressures 134/70-123/71 142/68 again she will be going home with a supportive husband will need continued PT and OT as well as RN support for strengthening she has a rolling walker at home.  She will need lab work done later this week with primary care provider notified of results.  [id number]

## 2014-08-28 DIAGNOSIS — Z483 Aftercare following surgery for neoplasm: Secondary | ICD-10-CM | POA: Diagnosis not present

## 2014-08-28 DIAGNOSIS — I10 Essential (primary) hypertension: Secondary | ICD-10-CM | POA: Diagnosis not present

## 2014-08-28 DIAGNOSIS — E119 Type 2 diabetes mellitus without complications: Secondary | ICD-10-CM | POA: Diagnosis not present

## 2014-08-28 DIAGNOSIS — E785 Hyperlipidemia, unspecified: Secondary | ICD-10-CM | POA: Diagnosis not present

## 2014-08-29 ENCOUNTER — Emergency Department (HOSPITAL_COMMUNITY): Payer: Medicare Other

## 2014-08-29 ENCOUNTER — Encounter (HOSPITAL_COMMUNITY): Payer: Self-pay | Admitting: *Deleted

## 2014-08-29 ENCOUNTER — Observation Stay (HOSPITAL_COMMUNITY)
Admission: EM | Admit: 2014-08-29 | Discharge: 2014-08-30 | Disposition: A | Discharge status: Home / Self Care | Payer: Medicare Other | Attending: Internal Medicine | Admitting: Internal Medicine

## 2014-08-29 DIAGNOSIS — R4182 Altered mental status, unspecified: Secondary | ICD-10-CM | POA: Diagnosis not present

## 2014-08-29 DIAGNOSIS — Z905 Acquired absence of kidney: Secondary | ICD-10-CM | POA: Diagnosis not present

## 2014-08-29 DIAGNOSIS — I959 Hypotension, unspecified: Secondary | ICD-10-CM | POA: Insufficient documentation

## 2014-08-29 DIAGNOSIS — C649 Malignant neoplasm of unspecified kidney, except renal pelvis: Secondary | ICD-10-CM | POA: Diagnosis not present

## 2014-08-29 DIAGNOSIS — Y929 Unspecified place or not applicable: Secondary | ICD-10-CM | POA: Insufficient documentation

## 2014-08-29 DIAGNOSIS — K219 Gastro-esophageal reflux disease without esophagitis: Secondary | ICD-10-CM | POA: Insufficient documentation

## 2014-08-29 DIAGNOSIS — I10 Essential (primary) hypertension: Secondary | ICD-10-CM | POA: Diagnosis present

## 2014-08-29 DIAGNOSIS — E160 Drug-induced hypoglycemia without coma: Secondary | ICD-10-CM

## 2014-08-29 DIAGNOSIS — T383X1A Poisoning by insulin and oral hypoglycemic [antidiabetic] drugs, accidental (unintentional), initial encounter: Secondary | ICD-10-CM | POA: Diagnosis not present

## 2014-08-29 DIAGNOSIS — X58XXXA Exposure to other specified factors, initial encounter: Secondary | ICD-10-CM | POA: Diagnosis not present

## 2014-08-29 DIAGNOSIS — E785 Hyperlipidemia, unspecified: Secondary | ICD-10-CM | POA: Insufficient documentation

## 2014-08-29 DIAGNOSIS — E119 Type 2 diabetes mellitus without complications: Secondary | ICD-10-CM

## 2014-08-29 DIAGNOSIS — Y999 Unspecified external cause status: Secondary | ICD-10-CM | POA: Diagnosis not present

## 2014-08-29 DIAGNOSIS — H409 Unspecified glaucoma: Secondary | ICD-10-CM | POA: Insufficient documentation

## 2014-08-29 DIAGNOSIS — E11649 Type 2 diabetes mellitus with hypoglycemia without coma: Secondary | ICD-10-CM | POA: Diagnosis not present

## 2014-08-29 DIAGNOSIS — Z79899 Other long term (current) drug therapy: Secondary | ICD-10-CM | POA: Diagnosis not present

## 2014-08-29 DIAGNOSIS — Z85528 Personal history of other malignant neoplasm of kidney: Secondary | ICD-10-CM | POA: Insufficient documentation

## 2014-08-29 DIAGNOSIS — R918 Other nonspecific abnormal finding of lung field: Secondary | ICD-10-CM | POA: Diagnosis present

## 2014-08-29 DIAGNOSIS — Y939 Activity, unspecified: Secondary | ICD-10-CM | POA: Diagnosis not present

## 2014-08-29 HISTORY — DX: Malignant (primary) neoplasm, unspecified: C80.1

## 2014-08-29 LAB — COMPREHENSIVE METABOLIC PANEL
ALT: 25 U/L (ref 14–54)
AST: 32 U/L (ref 15–41)
Albumin: 2.9 g/dL — ABNORMAL LOW (ref 3.5–5.0)
Alkaline Phosphatase: 64 U/L (ref 38–126)
Anion gap: 8 (ref 5–15)
BUN: 30 mg/dL — ABNORMAL HIGH (ref 6–20)
CO2: 28 mmol/L (ref 22–32)
CREATININE: 0.98 mg/dL (ref 0.44–1.00)
Calcium: 9.3 mg/dL (ref 8.9–10.3)
Chloride: 98 mmol/L — ABNORMAL LOW (ref 101–111)
GFR calc Af Amer: >60 mL/min (ref >60)
GFR calc non Af Amer: 53 mL/min — ABNORMAL LOW (ref >60)
Glucose, Bld: 106 mg/dL — ABNORMAL HIGH (ref 65–99)
POTASSIUM: 3.7 mmol/L (ref 3.5–5.1)
Sodium: 134 mmol/L — ABNORMAL LOW (ref 135–145)
Total Bilirubin: 0.6 mg/dL (ref 0.3–1.2)
Total Protein: 7.2 g/dL (ref 6.5–8.1)

## 2014-08-29 LAB — URINALYSIS, ROUTINE W REFLEX MICROSCOPIC
Bilirubin Urine: NEGATIVE
Glucose, UA: NEGATIVE mg/dL
HGB URINE DIPSTICK: NEGATIVE
Leukocytes, UA: NEGATIVE
Nitrite: NEGATIVE
PH: 5.5 (ref 5.0–8.0)
Protein, ur: NEGATIVE mg/dL
UROBILINOGEN UA: 1 mg/dL (ref 0.0–1.0)

## 2014-08-29 LAB — CBC
HCT: 32.4 % — ABNORMAL LOW (ref 36.0–46.0)
Hemoglobin: 10.1 g/dL — ABNORMAL LOW (ref 12.0–15.0)
MCH: 25.8 pg — AB (ref 26.0–34.0)
MCHC: 31.2 g/dL (ref 30.0–36.0)
MCV: 82.9 fL (ref 78.0–100.0)
PLATELETS: 373 10*3/uL (ref 150–400)
RBC: 3.91 MIL/uL (ref 3.87–5.11)
RDW: 16.5 % — ABNORMAL HIGH (ref 11.5–15.5)
WBC: 8.3 10*3/uL (ref 4.0–10.5)

## 2014-08-29 LAB — CBG MONITORING, ED
GLUCOSE-CAPILLARY: 152 mg/dL — AB (ref 65–99)
GLUCOSE-CAPILLARY: 157 mg/dL — AB (ref 65–99)
Glucose-Capillary: 62 mg/dL — ABNORMAL LOW (ref 65–99)
Glucose-Capillary: 213 mg/dL — ABNORMAL HIGH (ref 65–99)

## 2014-08-29 LAB — TROPONIN I

## 2014-08-29 LAB — GLUCOSE, CAPILLARY: Glucose-Capillary: 172 mg/dL — ABNORMAL HIGH (ref 65–99)

## 2014-08-29 MED ORDER — LOSARTAN POTASSIUM 50 MG PO TABS
ORAL_TABLET | ORAL | Status: AC
  Filled 2014-08-29: qty 1

## 2014-08-29 MED ORDER — BRIMONIDINE TARTRATE 0.2 % OP SOLN
OPHTHALMIC | Status: AC
  Filled 2014-08-29: qty 5

## 2014-08-29 MED ORDER — BISOPROLOL-HYDROCHLOROTHIAZIDE 5-6.25 MG PO TABS
1.0000 | ORAL_TABLET | Freq: Every day | ORAL | Status: DC
  Administered 2014-08-29 – 2014-08-30 (×2): 1 via ORAL
  Filled 2014-08-29 (×5): qty 1

## 2014-08-29 MED ORDER — SODIUM CHLORIDE 0.9 % IV SOLN: INTRAVENOUS | Status: DC

## 2014-08-29 MED ORDER — INSULIN ASPART 100 UNIT/ML ~~LOC~~ SOLN
0.0000 [IU] | Freq: Three times a day (TID) | SUBCUTANEOUS | Status: DC
  Administered 2014-08-30: 5 [IU] via SUBCUTANEOUS
  Administered 2014-08-30: 2 [IU] via SUBCUTANEOUS

## 2014-08-29 MED ORDER — METFORMIN HCL 500 MG PO TABS
1000.0000 mg | ORAL_TABLET | Freq: Two times a day (BID) | ORAL | Status: DC
  Administered 2014-08-30: 1000 mg via ORAL
  Filled 2014-08-29: qty 2

## 2014-08-29 MED ORDER — DORZOLAMIDE HCL-TIMOLOL MAL 2-0.5 % OP SOLN
1.0000 [drp] | Freq: Two times a day (BID) | OPHTHALMIC | Status: DC
  Administered 2014-08-29 – 2014-08-30 (×2): 1 [drp] via OPHTHALMIC
  Filled 2014-08-29: qty 10

## 2014-08-29 MED ORDER — INSULIN ASPART 100 UNIT/ML ~~LOC~~ SOLN: 0.0000 [IU] | Freq: Every day | SUBCUTANEOUS | Status: DC

## 2014-08-29 MED ORDER — SIMVASTATIN 20 MG PO TABS
20.0000 mg | ORAL_TABLET | Freq: Every day | ORAL | Status: DC
  Administered 2014-08-29: 20 mg via ORAL
  Filled 2014-08-29: qty 1

## 2014-08-29 MED ORDER — SODIUM CHLORIDE 0.9 % IV SOLN
INTRAVENOUS | Status: DC
  Administered 2014-08-29: 18:00:00 via INTRAVENOUS

## 2014-08-29 MED ORDER — ONDANSETRON HCL 4 MG/2ML IJ SOLN: 4.0000 mg | Freq: Four times a day (QID) | INTRAMUSCULAR | Status: DC | PRN

## 2014-08-29 MED ORDER — LOSARTAN POTASSIUM 50 MG PO TABS
50.0000 mg | ORAL_TABLET | Freq: Every day | ORAL | Status: DC
  Administered 2014-08-29 – 2014-08-30 (×2): 50 mg via ORAL
  Filled 2014-08-29: qty 1

## 2014-08-29 MED ORDER — PANTOPRAZOLE SODIUM 40 MG PO TBEC
80.0000 mg | DELAYED_RELEASE_TABLET | Freq: Every day | ORAL | Status: DC
  Administered 2014-08-29 – 2014-08-30 (×2): 80 mg via ORAL
  Filled 2014-08-29 (×2): qty 2

## 2014-08-29 MED ORDER — DOCUSATE SODIUM 100 MG PO CAPS: 100.0000 mg | ORAL_CAPSULE | Freq: Two times a day (BID) | ORAL | Status: DC | PRN

## 2014-08-29 MED ORDER — BRIMONIDINE TARTRATE 0.15 % OP SOLN
1.0000 [drp] | Freq: Two times a day (BID) | OPHTHALMIC | Status: DC
  Filled 2014-08-29: qty 5

## 2014-08-29 MED ORDER — BISOPROLOL-HYDROCHLOROTHIAZIDE 5-6.25 MG PO TABS
ORAL_TABLET | ORAL | Status: AC
  Filled 2014-08-29: qty 1

## 2014-08-29 MED ORDER — AMLODIPINE BESYLATE 5 MG PO TABS
5.0000 mg | ORAL_TABLET | Freq: Every day | ORAL | Status: DC
  Administered 2014-08-29 – 2014-08-30 (×2): 5 mg via ORAL
  Filled 2014-08-29 (×2): qty 1

## 2014-08-29 MED ORDER — ONDANSETRON HCL 4 MG PO TABS: 4.0000 mg | ORAL_TABLET | Freq: Four times a day (QID) | ORAL | Status: DC | PRN

## 2014-08-29 MED ORDER — SODIUM CHLORIDE 0.9 % IV BOLUS (SEPSIS)
250.0000 mL | Freq: Once | INTRAVENOUS | Status: AC
Start: 2014-08-29 — End: 2014-08-29
  Administered 2014-08-29: 250 mL via INTRAVENOUS

## 2014-08-29 MED ORDER — ALBUTEROL SULFATE (2.5 MG/3ML) 0.083% IN NEBU: 3.0000 mL | INHALATION_SOLUTION | Freq: Four times a day (QID) | RESPIRATORY_TRACT | Status: DC | PRN

## 2014-08-29 MED ORDER — ENSURE ENLIVE PO LIQD
237.0000 mL | Freq: Two times a day (BID) | ORAL | Status: DC
  Administered 2014-08-30: 237 mL via ORAL

## 2014-08-29 MED ORDER — HEPARIN SODIUM (PORCINE) 5000 UNIT/ML IJ SOLN
5000.0000 [IU] | Freq: Three times a day (TID) | INTRAMUSCULAR | Status: DC
  Administered 2014-08-29 – 2014-08-30 (×2): 5000 [IU] via SUBCUTANEOUS
  Filled 2014-08-29 (×2): qty 1

## 2014-08-29 MED ORDER — TRAMADOL HCL 50 MG PO TABS
50.0000 mg | ORAL_TABLET | Freq: Four times a day (QID) | ORAL | Status: DC | PRN
Start: 2014-08-29 — End: 2014-08-30

## 2014-08-29 MED ORDER — DORZOLAMIDE HCL-TIMOLOL MAL 2-0.5 % OP SOLN
OPHTHALMIC | Status: AC
  Filled 2014-08-29: qty 10

## 2014-08-29 MED ORDER — SODIUM CHLORIDE 0.9 % IV SOLN
INTRAVENOUS | Status: DC
  Administered 2014-08-29 – 2014-08-30 (×2): via INTRAVENOUS

## 2014-08-29 NOTE — ED Notes (Signed)
Pt ambulated with walker assist with no difficulties. NAD noted.   Pt was given a lunch tray with MD approval.

## 2014-08-29 NOTE — ED Notes (Addendum)
Pt is alert and oriented with no mental status change.

## 2014-08-29 NOTE — ED Provider Notes (Signed)
CSN: [id number]     Arrival date & time 08/29/14  1005 History   First MD Initiated Contact with Patient 08/29/14 1058     Chief Complaint  Patient presents with  . Altered Mental Status      HPI Pt was seen at 1125. Per EMS, pt's family and pt: c/o unknown onset and slow resolution of one episode of "confusion" that was noticed this morning when pt woke up. Pt's family states pt "wasn't acting right" and was "combative." EMS noted pt's CBG to be "40" on their arrival to scene. Pt was given PO food/fluids "with extra sugar" with CBG increasing to "54." Pt's family states pt is "acting better now." Pt states she "feels ok." Pt's LD of her DM meds was yesterday morning. Denies syncope, no facial droop, no focal motor weakness, no tingling/numbness in extremities, no CP/SOB, no abd pain, no N/V/D, no fevers.    Past Medical History  Diagnosis Date  . HYPERLIPIDEMIA 12/01/2009  . ESSENTIAL HYPERTENSION 12/16/2008  . ESOPHAGEAL STRICTURE 12/16/2008  . Heartburn 04/08/2008  . DYSPHAGIA 04/08/2008  . Cataract   . Depression   . DIABETES MELLITUS, CONTROLLED 09/01/2009  . GERD (gastroesophageal reflux disease)   . Glaucoma   . Barretts esophagus   . Hyperlipidemia   . Cancer     kidney -right   Past Surgical History  Procedure Laterality Date  . Cholecystectomy    . Abdominal hysterectomy  1984  . Esophagogastroduodenoscopy N/A 10/02/2012    Procedure: ESOPHAGOGASTRODUODENOSCOPY (EGD);  Surgeon: Rogene Houston, MD;  [facility];  Service: Endoscopy;  Laterality: N/A;  . Foreign body removal N/A 10/02/2012    Procedure: FOREIGN BODY REMOVAL;  Surgeon: Rogene Houston, MD;  [facility];  Service: Endoscopy;  Laterality: N/A;  . Laparoscopic nephrectomy Right 08/12/2014    Procedure: RIGHT LAPAROSCOPIC RADICAL NEPHRECTOMY;  Surgeon: Ardis Hughs, MD;  Location: [facility];  Service: Urology;  Laterality: Right;  . Nephrectomy      right   Family History  Problem  Relation Age of Onset  . Stroke Mother   . Cancer Sister     brain, breast   History  Substance Use Topics  . Smoking status: Never Smoker   . Smokeless tobacco: Never Used  . Alcohol Use: No    Review of Systems ROS: Statement: All systems negative except as marked or noted in the HPI; Constitutional: Negative for fever and chills. ; ; Eyes: Negative for eye pain, redness and discharge. ; ; ENMT: Negative for ear pain, hoarseness, nasal congestion, sinus pressure and sore throat. ; ; Cardiovascular: Negative for chest pain, palpitations, diaphoresis, dyspnea and peripheral edema. ; ; Respiratory: Negative for cough, wheezing and stridor. ; ; Gastrointestinal: Negative for nausea, vomiting, diarrhea, abdominal pain, blood in stool, hematemesis, jaundice and rectal bleeding. . ; ; Genitourinary: Negative for dysuria, flank pain and hematuria. ; ; Musculoskeletal: Negative for back pain and neck pain. Negative for swelling and trauma.; ; Skin: Negative for pruritus, rash, abrasions, blisters, bruising and skin lesion.; ; Neuro: +AMS. Negative for headache and neck stiffness. Negative for weakness, altered level of consciousness, extremity weakness, paresthesias, involuntary movement, seizure and syncope.      Allergies  Review of patient's allergies indicates no known allergies.  Home Medications   Prior to Admission medications   Medication Sig Start Date End Date Taking? Authorizing Provider  albuterol (PROVENTIL HFA;VENTOLIN HFA) 108 (90 BASE) MCG/ACT inhaler Inhale 1-2 puffs into the  lungs every 6 (six) hours as needed for wheezing or shortness of breath. 05/19/14  Yes Nat Christen, MD  amLODipine (NORVASC) 5 MG tablet Take 1 tablet (5 mg total) by mouth daily. 07/23/14  Yes Eulas Post, MD  bisoprolol-hydrochlorothiazide (ZIAC) 5-6.25 MG per tablet TAKE 1 TABLET EVERY MORNING FOR BLOOD PRESSURE 07/29/14  Yes Eulas Post, MD  brimonidine (ALPHAGAN) 0.15 % ophthalmic solution Place  1 drop into both eyes 2 (two) times daily.  09/28/11  Yes Historical Provider, MD  docusate sodium (COLACE) 100 MG capsule Take 1 capsule (100 mg total) by mouth 2 (two) times daily as needed (take to keep stool soft.). 08/16/14  Yes Ardis Hughs, MD  dorzolamide-timolol (COSOPT) 22.3-6.8 MG/ML ophthalmic solution Place 1 drop into both eyes 2 (two) times daily.  09/28/11  Yes Historical Provider, MD  glimepiride (AMARYL) 2 MG tablet TAKE 2 TABLET IN THE MORNING WITH BREAKFAST 07/09/14  Yes Eulas Post, MD  losartan (COZAAR) 50 MG tablet Take 1 tablet (50 mg total) by mouth daily. 07/23/14  Yes Eulas Post, MD  metFORMIN (GLUCOPHAGE) 500 MG tablet TAKE (2) TABLETS TWICE DAILY (MORNING AND EVEN- ING).   Yes Eulas Post, MD  NEXIUM 40 MG capsule TAKE 1 CAPSULE IN THE MORNING BEFORE BREAKFAST 09/12/13  Yes Ricard Dillon, MD  simvastatin (ZOCOR) 20 MG tablet Take 1 tablet (20 mg total) by mouth daily. 07/23/14  Yes Eulas Post, MD  traMADol Veatrice Bourbon) 50 MG tablet Take one or two tablets by mouth every 6 hours as needed for moderate pain 08/19/14  Yes Tiffany L Reed, DO  ondansetron (ZOFRAN) 4 MG tablet Take 1 tablet (4 mg total) by mouth every 8 (eight) hours as needed for nausea or vomiting. Patient not taking: Reported on 08/29/2014 08/16/14   Ardis Hughs, MD   BP 101/54 mmHg  Pulse 80  Temp(Src) 97.4 F (36.3 C) (Oral)  Resp 20  Ht 5\' 4"  (1.626 m)  Wt 155 lb (70.308 kg)  BMI 26.59 kg/m2  SpO2 96%   Filed Vitals:   08/29/14 1200 08/29/14 1230 08/29/14 1300 08/29/14 1442  BP: 105/54 110/64 109/54 101/54  Pulse:  77 77 80  Temp:      TempSrc:      Resp: 20 21 23 20   Height:      Weight:      SpO2:  97% 97% 96%   Initial VS: 120/76-76-20-97.4-Sats 100% R/A  12:12 Orthostatic Vital Signs CW  Orthostatic Lying  - BP- Lying: 105/54 mmHg ; Pulse- Lying: 77  Orthostatic Sitting - BP- Sitting: 117/67 mmHg ; Pulse- Sitting: 81  Orthostatic Standing at 0 minutes - BP-  Standing at 0 minutes: 115/64 mmHg ; Pulse- Standing at 0 minutes: 86      Physical Exam  1130: Physical examination:  Nursing notes reviewed; Vital signs and O2 SAT reviewed;  Constitutional: Well developed, Well nourished, In no acute distress; Head:  Normocephalic, atraumatic; Eyes: EOMI, PERRL, No scleral icterus; ENMT: Mouth and pharynx normal, Mucous membranes dry; Neck: Supple, Full range of motion, No lymphadenopathy; Cardiovascular: Regular rate and rhythm, No gallop; Respiratory: Breath sounds clear & equal bilaterally, No wheezes.  Speaking full sentences with ease, Normal respiratory effort/excursion; Chest: Nontender, Movement normal; Abdomen: Soft, Nontender, Nondistended, Normal bowel sounds; Genitourinary: No CVA tenderness; Extremities: Pulses normal, No tenderness, No edema, No calf edema or asymmetry.; Neuro: AA&Ox3, Major CN grossly intact. No facial droop. Speech clear. No gross focal motor  deficits in extremities.; Skin: Color normal, Warm, Dry.   ED Course  Procedures     EKG Interpretation   Date/Time:  Thursday Aug 29 2014 10:40:41 EDT Ventricular Rate:  77 PR Interval:  166 QRS Duration: 145 QT Interval:  406 QTC Calculation: 459 R Axis:   43 Text Interpretation:  Sinus rhythm Right bundle branch block Abnormal  inferior Q waves Baseline wander When compared with ECG of 05/19/2014 No  significant change was found Confirmed by [employer]  MD, Nunzio Cory [id number])  on 08/29/2014 11:34:34 AM      MDM  MDM Reviewed: previous chart, nursing note and vitals Reviewed previous: labs and ECG Interpretation: labs, ECG, x-ray and CT scan     Results for orders placed or performed during the hospital encounter of 08/29/14  CBC  Result Value Ref Range   WBC 8.3 4.0 - 10.5 K/uL   RBC 3.91 3.87 - 5.11 MIL/uL   Hemoglobin 10.1 (L) 12.0 - 15.0 g/dL   HCT 32.4 (L) 36.0 - 46.0 %   MCV 82.9 78.0 - 100.0 fL   MCH 25.8 (L) 26.0 - 34.0 pg   MCHC 31.2 30.0 - 36.0 g/dL   RDW  16.5 (H) 11.5 - 15.5 %   Platelets 373 150 - 400 K/uL  Comprehensive metabolic panel  Result Value Ref Range   Sodium 134 (L) 135 - 145 mmol/L   Potassium 3.7 3.5 - 5.1 mmol/L   Chloride 98 (L) 101 - 111 mmol/L   CO2 28 22 - 32 mmol/L   Glucose, Bld 106 (H) 65 - 99 mg/dL   BUN 30 (H) 6 - 20 mg/dL   Creatinine, Ser 0.98 0.44 - 1.00 mg/dL   Calcium 9.3 8.9 - 10.3 mg/dL   Total Protein 7.2 6.5 - 8.1 g/dL   Albumin 2.9 (L) 3.5 - 5.0 g/dL   AST 32 15 - 41 U/L   ALT 25 14 - 54 U/L   Alkaline Phosphatase 64 38 - 126 U/L   Total Bilirubin 0.6 0.3 - 1.2 mg/dL   GFR calc non Af Amer 53 (L) >60 mL/min   GFR calc Af Amer >60 >60 mL/min   Anion gap 8 5 - 15  Urinalysis, Routine w reflex microscopic  Result Value Ref Range   Color, Urine YELLOW YELLOW   APPearance CLEAR CLEAR   Specific Gravity, Urine >1.030 (H) 1.005 - 1.030   pH 5.5 5.0 - 8.0   Glucose, UA NEGATIVE NEGATIVE mg/dL   Hgb urine dipstick NEGATIVE NEGATIVE   Bilirubin Urine NEGATIVE NEGATIVE   Ketones, ur TRACE (A) NEGATIVE mg/dL   Protein, ur NEGATIVE NEGATIVE mg/dL   Urobilinogen, UA 1.0 0.0 - 1.0 mg/dL   Nitrite NEGATIVE NEGATIVE   Leukocytes, UA NEGATIVE NEGATIVE  Troponin I  Result Value Ref Range   Troponin I <0.03 <0.031 ng/mL  CBG monitoring, ED  Result Value Ref Range   Glucose-Capillary 62 (L) 65 - 99 mg/dL  CBG monitoring, ED  Result Value Ref Range   Glucose-Capillary 152 (H) 65 - 99 mg/dL  CBG monitoring, ED  Result Value Ref Range   Glucose-Capillary 157 (H) 65 - 99 mg/dL  CBG monitoring, ED  Result Value Ref Range   Glucose-Capillary 213 (H) 65 - 99 mg/dL   Dg Chest 2 View 08/29/2014   CLINICAL DATA:  81 year old female with altered mental status. Post right nephrectomy for cancer. High blood pressure. Hyperlipidemia. Initial encounter.  EXAM: CHEST  2 VIEW  COMPARISON:  06/06/2014 CT.  09/23/2010 chest x-ray.  FINDINGS: Cardiomegaly.  Central pulmonary vascular prominence.  Pulmonary nodules better  demonstrated on prior CT and may indicate metastatic disease.  Posterior aspect the left hemidiaphragm is elevated without segmental consolidation detected.  Calcified tortuous aorta.  IMPRESSION: Cardiomegaly.  Mild central pulmonary vascular prominence without pulmonary edema.  Pulmonary nodules better demonstrated on prior CT and may indicate metastatic disease.  Calcified mildly tortuous aorta.   Electronically Signed   By: Genia Del M.D.   On: 08/29/2014 12:39   Ct Head Wo Contrast 08/29/2014   CLINICAL DATA:  Altered mental status  EXAM: CT HEAD WITHOUT CONTRAST  TECHNIQUE: Contiguous axial images were obtained from the base of the skull through the vertex without intravenous contrast.  COMPARISON:  CT head 08/11/2005  FINDINGS: Generalized atrophy. Moderate chronic microvascular ischemic changes in the white matter are stable from the prior study  Negative for acute infarct.  Negative for hemorrhage or mass.  Calvarium intact.  Atherosclerotic calcification.  IMPRESSION: Atrophy and chronic microvascular ischemia.  No acute abnormality.   Electronically Signed   By: Franchot Gallo M.D.   On: 08/29/2014 12:29    1615:  Pt's BP has been drifting downward throughout her ED stay. CBG has been steadily increasing since she has eaten meals x2. During orthostatic VS pt c/o feeling "dizzy." Pt ambulated and also c/o feeling "dizzy." Pt further describes this as "lightheadedness." Family and pt now state that pt "hasn't been eating or drinking a lot" for the past 1 to 2 weeks, and "has been saying she's been feeling lightheaded and falling a lot." Denies syncope. Will continue judicious IVF.  Dx and testing d/w pt and family.  Questions answered.  Verb understanding, agreeable to admit.  T/C to [facility]. Anastasio Champion, case discussed, including:  HPI, pertinent PM/SHx, VS/PE, dx testing, ED course and treatment:  Agreeable to admit, requests to write temporary orders, obtain medical bed to team  [facility].   Francine Graven, DO 08/31/14 1500

## 2014-08-29 NOTE — H&P (Signed)
[facility] History and Physical  Kathleen Schroeder CBJ:[id number] DOB: September 26, 1930 DOA: 08/29/2014  Referring physician: ER, Dr. Thurnell Garbe PCP: Eulas Post, MD   Chief Complaint: Altered mental status  HPI: Kathleen Schroeder is a 81 y.o. female  This is an 81 year old lady who at 6:00 this morning felt very weak and was unable to get out of her bed. She apparently then became rather confused. The family alerted EMS and EMS found her to have a CBG of only 40. She was given oral food/fluids and this only increased her CBG 254. She was brought into the emergency room where she was still hypoglycemic. She was given intravenous glucose and she has improved. She however, feels still weak and dizzy. She does not feel safe enough to go back home. Approximately 2 weeks ago, she had a nephrectomy for renal cancer and she has had poor by mouth intake since that time. In particular, she did not eat any dinner yesterday and still took her oral hypoglycemic medicines. She denies any chest pain, dyspnea, palpitations, limb weakness, cough or fever. She is now being admitted for observation overnight.   Review of Systems:  Apart from symptoms above, all systems negative.  Past Medical History  Diagnosis Date  . HYPERLIPIDEMIA 12/01/2009  . ESSENTIAL HYPERTENSION 12/16/2008  . ESOPHAGEAL STRICTURE 12/16/2008  . Heartburn 04/08/2008  . DYSPHAGIA 04/08/2008  . Cataract   . Depression   . DIABETES MELLITUS, CONTROLLED 09/01/2009  . GERD (gastroesophageal reflux disease)   . Glaucoma   . Barretts esophagus   . Hyperlipidemia   . Cancer     kidney -right   Past Surgical History  Procedure Laterality Date  . Cholecystectomy    . Abdominal hysterectomy  1984  . Esophagogastroduodenoscopy N/A 10/02/2012    Procedure: ESOPHAGOGASTRODUODENOSCOPY (EGD);  Surgeon: Rogene Houston, MD;  [facility];  Service: Endoscopy;  Laterality: N/A;  . Foreign body removal N/A 10/02/2012    Procedure:  FOREIGN BODY REMOVAL;  Surgeon: Rogene Houston, MD;  [facility];  Service: Endoscopy;  Laterality: N/A;  . Laparoscopic nephrectomy Right 08/12/2014    Procedure: RIGHT LAPAROSCOPIC RADICAL NEPHRECTOMY;  Surgeon: Ardis Hughs, MD;  Location: [facility];  Service: Urology;  Laterality: Right;  . Nephrectomy      right   Social History:  reports that she has never smoked. She has never used smokeless tobacco. She reports that she does not drink alcohol or use illicit drugs.  No Known Allergies  Family History  Problem Relation Age of Onset  . Stroke Mother   . Cancer Sister     brain, breast    Prior to Admission medications   Medication Sig Start Date End Date Taking? Authorizing Provider  albuterol (PROVENTIL HFA;VENTOLIN HFA) 108 (90 BASE) MCG/ACT inhaler Inhale 1-2 puffs into the lungs every 6 (six) hours as needed for wheezing or shortness of breath. 05/19/14  Yes Nat Christen, MD  amLODipine (NORVASC) 5 MG tablet Take 1 tablet (5 mg total) by mouth daily. 07/23/14  Yes Eulas Post, MD  bisoprolol-hydrochlorothiazide (ZIAC) 5-6.25 MG per tablet TAKE 1 TABLET EVERY MORNING FOR BLOOD PRESSURE 07/29/14  Yes Eulas Post, MD  brimonidine (ALPHAGAN) 0.15 % ophthalmic solution Place 1 drop into both eyes 2 (two) times daily.  09/28/11  Yes Historical Provider, MD  docusate sodium (COLACE) 100 MG capsule Take 1 capsule (100 mg total) by mouth 2 (two) times daily as needed (take to keep stool soft.).  08/16/14  Yes Ardis Hughs, MD  dorzolamide-timolol (COSOPT) 22.3-6.8 MG/ML ophthalmic solution Place 1 drop into both eyes 2 (two) times daily.  09/28/11  Yes Historical Provider, MD  glimepiride (AMARYL) 2 MG tablet TAKE 2 TABLET IN THE MORNING WITH BREAKFAST 07/09/14  Yes Eulas Post, MD  losartan (COZAAR) 50 MG tablet Take 1 tablet (50 mg total) by mouth daily. 07/23/14  Yes Eulas Post, MD  metFORMIN (GLUCOPHAGE) 500 MG tablet TAKE (2) TABLETS TWICE DAILY  (MORNING AND EVEN- ING).   Yes Eulas Post, MD  NEXIUM 40 MG capsule TAKE 1 CAPSULE IN THE MORNING BEFORE BREAKFAST 09/12/13  Yes Ricard Dillon, MD  simvastatin (ZOCOR) 20 MG tablet Take 1 tablet (20 mg total) by mouth daily. 07/23/14  Yes Eulas Post, MD  traMADol Veatrice Bourbon) 50 MG tablet Take one or two tablets by mouth every 6 hours as needed for moderate pain 08/19/14  Yes Tiffany L Reed, DO  ondansetron (ZOFRAN) 4 MG tablet Take 1 tablet (4 mg total) by mouth every 8 (eight) hours as needed for nausea or vomiting. Patient not taking: Reported on 08/29/2014 08/16/14   Ardis Hughs, MD   Physical Exam: Filed Vitals:   08/29/14 1530 08/29/14 1600 08/29/14 1700 08/29/14 1828  BP: 120/64 123/60 118/61 132/67  Pulse: 78 79 78 86  Temp:    98.1 F (36.7 C)  TempSrc:      Resp: 22 21 19 20   Height:    5\' 8"  (1.727 m)  Weight:    70.308 kg (155 lb)  SpO2: 95% 96% 95% 100%    Wt Readings from Last 3 Encounters:  08/29/14 70.308 kg (155 lb)  08/13/14 70.9 kg (156 lb 4.9 oz)  07/25/14 68.947 kg (152 lb)    General:  Appears calm and comfortable. She is alert and oriented. She is slightly dehydrated. Eyes: PERRL, normal lids, irises & conjunctiva ENT: grossly normal hearing, lips & tongue Neck: no LAD, masses or thyromegaly Cardiovascular: RRR, no m/r/g. No LE edema. Telemetry: SR, no arrhythmias  Respiratory: CTA bilaterally, no w/r/r. Normal respiratory effort. Abdomen: soft, ntnd Skin: no rash or induration seen on limited exam Musculoskeletal: grossly normal tone BUE/BLE Psychiatric: grossly normal mood and affect, speech fluent and appropriate Neurologic: grossly non-focal.          Labs on Admission:  Basic Metabolic Panel:  Recent Labs Lab 08/26/14 0600 08/29/14 1020  NA 132* 134*  K 3.9 3.7  CL 99* 98*  CO2 25 28  GLUCOSE 112* 106*  BUN 18 30*  CREATININE 0.96 0.98  CALCIUM 9.5 9.3   Liver Function Tests:  Recent Labs Lab 08/29/14 1020  AST 32    ALT 25  ALKPHOS 64  BILITOT 0.6  PROT 7.2  ALBUMIN 2.9*   No results for input(s): LIPASE, AMYLASE in the last 168 hours. No results for input(s): AMMONIA in the last 168 hours. CBC:  Recent Labs Lab 08/29/14 1020  WBC 8.3  HGB 10.1*  HCT 32.4*  MCV 82.9  PLT 373   Cardiac Enzymes:  Recent Labs Lab 08/29/14 1020  TROPONINI <0.03    BNP (last 3 results) No results for input(s): BNP in the last 8760 hours.  ProBNP (last 3 results) No results for input(s): PROBNP in the last 8760 hours.  CBG:  Recent Labs Lab 08/29/14 1001 08/29/14 1128 08/29/14 1307 08/29/14 1431  GLUCAP 62* 152* 157* 213*    Radiological Exams on Admission: Dg Chest  2 View  08/29/2014   CLINICAL DATA:  81 year old female with altered mental status. Post right nephrectomy for cancer. High blood pressure. Hyperlipidemia. Initial encounter.  EXAM: CHEST  2 VIEW  COMPARISON:  06/06/2014 CT.  09/23/2010 chest x-ray.  FINDINGS: Cardiomegaly.  Central pulmonary vascular prominence.  Pulmonary nodules better demonstrated on prior CT and may indicate metastatic disease.  Posterior aspect the left hemidiaphragm is elevated without segmental consolidation detected.  Calcified tortuous aorta.  IMPRESSION: Cardiomegaly.  Mild central pulmonary vascular prominence without pulmonary edema.  Pulmonary nodules better demonstrated on prior CT and may indicate metastatic disease.  Calcified mildly tortuous aorta.   Electronically Signed   By: Genia Del M.D.   On: 08/29/2014 12:39   Ct Head Wo Contrast  08/29/2014   CLINICAL DATA:  Altered mental status  EXAM: CT HEAD WITHOUT CONTRAST  TECHNIQUE: Contiguous axial images were obtained from the base of the skull through the vertex without intravenous contrast.  COMPARISON:  CT head 08/11/2005  FINDINGS: Generalized atrophy. Moderate chronic microvascular ischemic changes in the white matter are stable from the prior study  Negative for acute infarct.  Negative for  hemorrhage or mass.  Calvarium intact.  Atherosclerotic calcification.  IMPRESSION: Atrophy and chronic microvascular ischemia.  No acute abnormality.   Electronically Signed   By: Franchot Gallo M.D.   On: 08/29/2014 12:29      Assessment/Plan   1. Altered mental status. This was due to her hypoglycemia. She is also somewhat dehydrated. This is improved now since she is had intravenous dextrose and fluids. 2. Hypoglycemia, seems to be improved. Monitor closely. 3. Diabetes with recent poor intake. 4. Hypertension. Stable. 5. Status post nephrectomy for renal cancer.  Further recommendations will depend on patient's hospital progress.  Code Status: Full code  DVT Prophylaxis: Heparin.  Family Communication: I discussed the plan with the patient at the bedside.   Disposition Plan: Home when medically stable, probably tomorrow.   Time spent: 45 minutes.  Doree Albee [facility] Pager [phone].

## 2014-08-29 NOTE — ED Notes (Addendum)
Husband called for for pt " not acting right" upon EMS arrival CBG 40, pt drank OJ and ate  Peanut butter crackers; last CBG was 54; pt alert and oriented presently; pt had recent surgery to remove right kidney due to cancer; pt. Denies pain or numbness/tingling anywhere, denies pain and SOB; pt brought in for evaluation since CBG not coming up appropriately; CBG 62 upon arrival to ER;

## 2014-08-30 ENCOUNTER — Encounter (HOSPITAL_COMMUNITY): Payer: Self-pay | Admitting: *Deleted

## 2014-08-30 DIAGNOSIS — R918 Other nonspecific abnormal finding of lung field: Secondary | ICD-10-CM | POA: Diagnosis not present

## 2014-08-30 DIAGNOSIS — E119 Type 2 diabetes mellitus without complications: Secondary | ICD-10-CM | POA: Diagnosis not present

## 2014-08-30 DIAGNOSIS — I10 Essential (primary) hypertension: Secondary | ICD-10-CM | POA: Diagnosis not present

## 2014-08-30 DIAGNOSIS — T383X1A Poisoning by insulin and oral hypoglycemic [antidiabetic] drugs, accidental (unintentional), initial encounter: Secondary | ICD-10-CM | POA: Diagnosis not present

## 2014-08-30 LAB — CBC
HCT: 30.6 % — ABNORMAL LOW (ref 36.0–46.0)
HEMOGLOBIN: 9.6 g/dL — AB (ref 12.0–15.0)
MCH: 25.7 pg — ABNORMAL LOW (ref 26.0–34.0)
MCHC: 31.4 g/dL (ref 30.0–36.0)
MCV: 82 fL (ref 78.0–100.0)
PLATELETS: 333 10*3/uL (ref 150–400)
RBC: 3.73 MIL/uL — ABNORMAL LOW (ref 3.87–5.11)
RDW: 16.3 % — ABNORMAL HIGH (ref 11.5–15.5)
WBC: 5.7 10*3/uL (ref 4.0–10.5)

## 2014-08-30 LAB — GLUCOSE, CAPILLARY
GLUCOSE-CAPILLARY: 134 mg/dL — AB (ref 65–99)
GLUCOSE-CAPILLARY: 236 mg/dL — AB (ref 65–99)

## 2014-08-30 LAB — COMPREHENSIVE METABOLIC PANEL
ALK PHOS: 58 U/L (ref 38–126)
ALT: 22 U/L (ref 14–54)
AST: 24 U/L (ref 15–41)
Albumin: 2.6 g/dL — ABNORMAL LOW (ref 3.5–5.0)
Anion gap: 7 (ref 5–15)
BILIRUBIN TOTAL: 0.5 mg/dL (ref 0.3–1.2)
BUN: 20 mg/dL (ref 6–20)
CHLORIDE: 100 mmol/L — AB (ref 101–111)
CO2: 27 mmol/L (ref 22–32)
CREATININE: 0.86 mg/dL (ref 0.44–1.00)
Calcium: 8.7 mg/dL — ABNORMAL LOW (ref 8.9–10.3)
Glucose, Bld: 149 mg/dL — ABNORMAL HIGH (ref 65–99)
Potassium: 4 mmol/L (ref 3.5–5.1)
SODIUM: 134 mmol/L — AB (ref 135–145)
TOTAL PROTEIN: 6.5 g/dL (ref 6.5–8.1)

## 2014-08-30 NOTE — Care Management Note (Signed)
Case Management Note  Patient Details  Name: Kathleen Schroeder MRN: [id number] Date of Birth: 30-Dec-1930  Subjective/Objective:                  Pt admitted from home with hypoglycemia. Pt lives with her husband and will return home at discharge. Pt has a son who is very active in the care of the pt. Pt is active with [facility] RN, PT, and OT and wants to resume this at discharge. Pt has a cane and rollator as well.  Action/Plan: Anticipate discharge home today with resumption of [facility]. [place] services to start within 48 hours of discharge. Pt and pts nurse aware of discharge arrangements.  Expected Discharge Date:                  Expected Discharge Plan:  [place]  In-House Referral:  NA  Discharge planning Services  CM Consult  Post Acute Care Choice:  Resumption of Svcs/PTA Provider Choice offered to:  Patient  DME Arranged:    DME Agency:     HH Arranged:  RN, PT, OT HH Agency:  [place]  Status of Service:  Completed, signed off  Medicare Important Message Given:    Date Medicare IM Given:    Medicare IM give by:    Date Additional Medicare IM Given:    Additional Medicare Important Message give by:     If discussed at [place] of Stay Meetings, dates discussed:    Additional Comments:  Joylene Draft, RN 08/30/2014, 11:20 AM

## 2014-08-30 NOTE — Progress Notes (Signed)
Initial Nutrition Assessment  DOCUMENTATION CODES:  Non-severe (moderate) malnutrition in context of acute illness/injury  INTERVENTION:    Ensure Enlive po BID, each supplement provides 350 kcal and 20 grams of protein   NUTRITION DIAGNOSIS:  Predicted suboptimal nutrient intake related to acute illness as evidenced by estimated needs, per patient/family report.  GOAL:  Patient will meet greater than or equal to 90% of their needs  MONITOR:  PO intake, Supplement acceptance, Weight trends  REASON FOR ASSESSMENT:  Malnutrition Screening Tool    ASSESSMENT: Pt husband and son present. Poor po intake for past 2 weeks per son "she's just not eating anything". Altered mental status and weakness on admission. S/p nephrectomy secondary to renal cancer in late April. Weight hx:  Significant weight loss in late January early Feb (7%) which she has not been able to regain. Diet: she feeds herself and home diet is regular. She is receiving Ensure Enlive and recommend she continue to consume BID daily morning and evening between meals. Food preferences obtained this morning during visit but pt says " I'm going home today". Height:  Ht Readings from Last 1 Encounters:  08/29/14 5\' 8"  (1.727 m)    Weight:  Wt Readings from Last 1 Encounters:  08/29/14 155 lb (70.308 kg)    Ideal Body Weight:  64 kg  Wt Readings from Last 10 Encounters:  08/29/14 155 lb (70.308 kg)  08/13/14 156 lb 4.9 oz (70.9 kg)  07/25/14 152 lb (68.947 kg)  05/30/14 155 lb (70.308 kg)  05/23/14 155 lb (70.308 kg)  05/19/14 167 lb (75.751 kg)  09/28/13 167 lb (75.751 kg)  09/18/13 167 lb (75.751 kg)  03/19/13 170 lb (77.111 kg)  10/01/12 166 lb (75.297 kg)    BMI:  Body mass index is 23.57 kg/(m^2).  Estimated Nutritional Needs:  Kcal:  1750-1900  Protein:  84-91 gr  Fluid:  1.8-1.9 liters daily  Skin:   intact  Diet Order:  Diet Carb Modified Fluid consistency:: Thin; Room service  appropriate?: Yes  EDUCATION NEEDS:  No education needs identified at this time   Intake/Output Summary (Last 24 hours) at 08/30/14 1217 Last data filed at 08/30/14 1105  Gross per 24 hour  Intake 1155.84 ml  Output   1250 ml  Net -94.16 ml    Last BM:  5/10  Colman Cater MS,RD,CSG,LDN Office: [phone] Pager: [phone]

## 2014-08-30 NOTE — Progress Notes (Signed)
Discharge instructions reviewed, verbalized understanding. IV removed and catheter intact. To lobby via wheelchair in stable condition at this time.

## 2014-08-30 NOTE — Discharge Summary (Signed)
Physician Discharge Summary  Kathleen Schroeder OEV:[id number] DOB: July 10, 1930 DOA: 08/29/2014  PCP: Eulas Post, MD  Admit date: 08/29/2014 Discharge date: 08/30/2014  Time spent: 20 minutes  Recommendations for Outpatient Follow-up:  1. Please follow blood glucose closely. Pt is continued on metformin alone for diabetes given hypoglycemic episode  Discharge Diagnoses:  Active Problems:   Type 2 diabetes mellitus, controlled   Essential hypertension   Pulmonary nodules   Renal cancer   Altered mental status   Hypoglycemia secondary to sulfonylurea   Discharge Condition: Improved  Diet recommendation: Diabetic  Filed Weights   08/29/14 1001 08/29/14 1828  Weight: 70.308 kg (155 lb) 70.308 kg (155 lb)    History of present illness:  Please see admit h and p from 5/12 for details. Briefly, pt presents with acute delirium with profound hypoglycemia. The patient was admitted for further work up.  Hospital Course:  The patient was admitted to the floor. The patient was continued on dextrose containing IVF with improvement of blood glucose and return of normal mentation by the following day. The patient's renal function remained normal. The patient was advised to continue with metformin alone and to hold glimepiride.   Consultations:    Discharge Exam: Filed Vitals:   08/29/14 1828 08/29/14 2128 08/30/14 0612 08/30/14 1402  BP: 132/67 134/69 138/54 125/59  Pulse: 86 84 78 85  Temp: 98.1 F (36.7 C) 99 F (37.2 C) 98.2 F (36.8 C) 98.3 F (36.8 C)  TempSrc:  Oral Oral Oral  Resp: 20 18 18 19   Height: 5\' 8"  (1.727 m)     Weight: 70.308 kg (155 lb)     SpO2: 100% 94% 99% 98%    General: awake, in nad  Cardiovascular: regular, s1, s2 Respiratory: normal resp effort, no wheezing  Discharge Instructions     Medication List    STOP taking these medications        glimepiride 2 MG tablet  Commonly known as:  AMARYL      TAKE these medications         albuterol 108 (90 BASE) MCG/ACT inhaler  Commonly known as:  PROVENTIL HFA;VENTOLIN HFA  Inhale 1-2 puffs into the lungs every 6 (six) hours as needed for wheezing or shortness of breath.     amLODipine 5 MG tablet  Commonly known as:  NORVASC  Take 1 tablet (5 mg total) by mouth daily.     bisoprolol-hydrochlorothiazide 5-6.25 MG per tablet  Commonly known as:  ZIAC  TAKE 1 TABLET EVERY MORNING FOR BLOOD PRESSURE     brimonidine 0.15 % ophthalmic solution  Commonly known as:  ALPHAGAN  Place 1 drop into both eyes 2 (two) times daily.     docusate sodium 100 MG capsule  Commonly known as:  COLACE  Take 1 capsule (100 mg total) by mouth 2 (two) times daily as needed (take to keep stool soft.).     dorzolamide-timolol 22.3-6.8 MG/ML ophthalmic solution  Commonly known as:  COSOPT  Place 1 drop into both eyes 2 (two) times daily.     losartan 50 MG tablet  Commonly known as:  COZAAR  Take 1 tablet (50 mg total) by mouth daily.     metFORMIN 500 MG tablet  Commonly known as:  GLUCOPHAGE  TAKE (2) TABLETS TWICE DAILY (MORNING AND EVEN- ING).     NEXIUM 40 MG capsule  Generic drug:  esomeprazole  TAKE 1 CAPSULE IN THE MORNING BEFORE BREAKFAST     ondansetron 4  MG tablet  Commonly known as:  ZOFRAN  Take 1 tablet (4 mg total) by mouth every 8 (eight) hours as needed for nausea or vomiting.     simvastatin 20 MG tablet  Commonly known as:  ZOCOR  Take 1 tablet (20 mg total) by mouth daily.     traMADol 50 MG tablet  Commonly known as:  ULTRAM  Take one or two tablets by mouth every 6 hours as needed for moderate pain       No Known Allergies Follow-up Information    Follow up with Eulas Post, MD.   Specialty:  Family Medicine   Why:  as scheduled for 5/17, Hospital follow up   Contact information:   [place] [address] [phone]       Follow up with [facility].   Contact information:   [address] [place] [address] [phone]        The results of significant diagnostics from this hospitalization (including imaging, microbiology, ancillary and laboratory) are listed below for reference.    Significant Diagnostic Studies: Dg Chest 2 View  08/29/2014   CLINICAL DATA:  81 year old female with altered mental status. Post right nephrectomy for cancer. High blood pressure. Hyperlipidemia. Initial encounter.  EXAM: CHEST  2 VIEW  COMPARISON:  06/06/2014 CT.  09/23/2010 chest x-ray.  FINDINGS: Cardiomegaly.  Central pulmonary vascular prominence.  Pulmonary nodules better demonstrated on prior CT and may indicate metastatic disease.  Posterior aspect the left hemidiaphragm is elevated without segmental consolidation detected.  Calcified tortuous aorta.  IMPRESSION: Cardiomegaly.  Mild central pulmonary vascular prominence without pulmonary edema.  Pulmonary nodules better demonstrated on prior CT and may indicate metastatic disease.  Calcified mildly tortuous aorta.   Electronically Signed   By: Genia Del M.D.   On: 08/29/2014 12:39   Ct Head Wo Contrast  08/29/2014   CLINICAL DATA:  Altered mental status  EXAM: CT HEAD WITHOUT CONTRAST  TECHNIQUE: Contiguous axial images were obtained from the base of the skull through the vertex without intravenous contrast.  COMPARISON:  CT head 08/11/2005  FINDINGS: Generalized atrophy. Moderate chronic microvascular ischemic changes in the white matter are stable from the prior study  Negative for acute infarct.  Negative for hemorrhage or mass.  Calvarium intact.  Atherosclerotic calcification.  IMPRESSION: Atrophy and chronic microvascular ischemia.  No acute abnormality.   Electronically Signed   By: Franchot Gallo M.D.   On: 08/29/2014 12:29    Microbiology: No results found for this or any previous visit (from the past 240 hour(s)).   Labs: Basic Metabolic Panel:  Recent Labs Lab 08/26/14 0600 08/29/14 1020 08/30/14 0625  NA 132*  134* 134*  K 3.9 3.7 4.0  CL 99* 98* 100*  CO2 25 28 27   GLUCOSE 112* 106* 149*  BUN 18 30* 20  CREATININE 0.96 0.98 0.86  CALCIUM 9.5 9.3 8.7*   Liver Function Tests:  Recent Labs Lab 08/29/14 1020 08/30/14 0625  AST 32 24  ALT 25 22  ALKPHOS 64 58  BILITOT 0.6 0.5  PROT 7.2 6.5  ALBUMIN 2.9* 2.6*   No results for input(s): LIPASE, AMYLASE in the last 168 hours. No results for input(s): AMMONIA in the last 168 hours. CBC:  Recent Labs Lab 08/29/14 1020 08/30/14 0625  WBC 8.3 5.7  HGB 10.1* 9.6*  HCT 32.4* 30.6*  MCV 82.9 82.0  PLT 373 333   Cardiac Enzymes:  Recent Labs Lab  08/29/14 1020  TROPONINI <0.03   BNP: BNP (last 3 results) No results for input(s): BNP in the last 8760 hours.  ProBNP (last 3 results) No results for input(s): PROBNP in the last 8760 hours.  CBG:  Recent Labs Lab 08/29/14 1307 08/29/14 1431 08/29/14 2053 08/30/14 0757 08/30/14 1142  GLUCAP 157* 213* 172* 134* 236*    Signed:  Illyana Schorsch K  [facility] 08/30/2014, 6:26 PM

## 2014-09-03 ENCOUNTER — Encounter: Payer: Self-pay | Admitting: Family Medicine

## 2014-09-03 ENCOUNTER — Ambulatory Visit (INDEPENDENT_AMBULATORY_CARE_PROVIDER_SITE_OTHER): Payer: Medicare Other | Admitting: Family Medicine

## 2014-09-03 VITALS — BP 130/70 | HR 86 | Temp 97.5°F | Wt 141.0 lb

## 2014-09-03 DIAGNOSIS — R634 Abnormal weight loss: Secondary | ICD-10-CM | POA: Diagnosis not present

## 2014-09-03 DIAGNOSIS — R531 Weakness: Secondary | ICD-10-CM

## 2014-09-03 DIAGNOSIS — E162 Hypoglycemia, unspecified: Secondary | ICD-10-CM

## 2014-09-03 NOTE — Progress Notes (Signed)
Subjective:    Patient ID: Kathleen Schroeder, female    DOB: 05-11-1930, 81 y.o.   MRN: [id number]  HPI    Patient seen for hospital follow-up and rehabilitation follow-up. She had recent right nephrectomy for clear cell tumor.   She did well following her surgery and went to rehabilitation was discharged from there on the 10th. She was then admitted on 08/29/2014 with acute delirium and found to have hypoglycemia with blood sugar of 38. Patient takes metformin and had been on glimepiride 2 mg daily as well. Her symptoms improved with IV dextrose and patient was discharged on metformin in addition her usual blood pressure medications. Labs from recent hospitalization reviewed. They were basically unremarkable. Troponins negative. No major electrolyte abnormality.   She's had generalized weakness and loss of appetite following her surgery and has home PT and OT. She has been somewhat reluctant to work with physical therapy. She's not had any fever. Her wounds are healing well from recent surgery. She has had fairly substantial weight loss over the past few months.  Past Medical History  Diagnosis Date  . HYPERLIPIDEMIA 12/01/2009  . ESSENTIAL HYPERTENSION 12/16/2008  . ESOPHAGEAL STRICTURE 12/16/2008  . Heartburn 04/08/2008  . DYSPHAGIA 04/08/2008  . Cataract   . Depression   . DIABETES MELLITUS, CONTROLLED 09/01/2009  . GERD (gastroesophageal reflux disease)   . Glaucoma   . Barretts esophagus   . Hyperlipidemia   . Cancer     kidney -right   Past Surgical History  Procedure Laterality Date  . Cholecystectomy    . Abdominal hysterectomy  1984  . Esophagogastroduodenoscopy N/A 10/02/2012    Procedure: ESOPHAGOGASTRODUODENOSCOPY (EGD);  Surgeon: Rogene Houston, MD;  [facility];  Service: Endoscopy;  Laterality: N/A;  . Foreign body removal N/A 10/02/2012    Procedure: FOREIGN BODY REMOVAL;  Surgeon: Rogene Houston, MD;  Location: [facility];  Service: Endoscopy;   Laterality: N/A;  . Laparoscopic nephrectomy Right 08/12/2014    Procedure: RIGHT LAPAROSCOPIC RADICAL NEPHRECTOMY;  Surgeon: Ardis Hughs, MD;  Location: [facility];  Service: Urology;  Laterality: Right;  . Nephrectomy      right    reports that she has never smoked. She has never used smokeless tobacco. She reports that she does not drink alcohol or use illicit drugs. family history includes Cancer in her sister; Stroke in her mother. No Known Allergies    Review of Systems  Constitutional: Positive for appetite change and fatigue. Negative for fever and chills.  Respiratory: Negative for cough.   Cardiovascular: Negative for chest pain.  Neurological: Positive for weakness.       Objective:   Physical Exam  Constitutional: She is oriented to person, place, and time. She appears well-developed and well-nourished.  Cardiovascular: Normal rate and regular rhythm.   Pulmonary/Chest: Effort normal and breath sounds normal. No respiratory distress. She has no wheezes. She has no rales.  Musculoskeletal: She exhibits no edema.  Neurological: She is alert and oriented to person, place, and time. No cranial nerve deficit.  Psychiatric: She has a normal mood and affect. Her behavior is normal. Judgment and thought content normal.          Assessment & Plan:  Patient presents with recent hypoglycemia and acute delirium which cleared promptly with IV dextrose. No further use of glimepiride or any other sulfonylureas. Continue metformin for now. We'll plan to repeat A1c in one month and if this is declining consider tapering back  her metformin as well. She is strongly encouraged to be compliant with physical therapy and occupational therapy and we discussed some dietary factors try to increase her intake of calories and protein.

## 2014-09-03 NOTE — Progress Notes (Signed)
Pre visit review using our clinic review tool, if applicable. No additional management support is needed unless otherwise documented below in the visit note. 

## 2014-09-17 ENCOUNTER — Other Ambulatory Visit: Payer: Self-pay | Admitting: Family Medicine

## 2014-09-20 ENCOUNTER — Telehealth: Payer: Self-pay | Admitting: Family Medicine

## 2014-09-20 MED ORDER — MIRTAZAPINE 15 MG PO TABS: 15.0000 mg | ORAL_TABLET | Freq: Every day | ORAL | Status: DC

## 2014-09-20 NOTE — Telephone Encounter (Signed)
i would not recommend Reglan unless she has documented gastroparesis.  Can cause extra-pyramidal side effects- and will not help appetite unless she has gastroparesis..  If she is having appetite issues we could discuss with family options such as Megace.

## 2014-09-20 NOTE — Telephone Encounter (Signed)
Kathleen Schroeder from [facility] call to ask for a rx for Reglan to see if that will help pt with eating better. Pt son concern about her not eating enough.     Verdis Frederickson would like a call back  [phone]    Pharmacy ;  [place]

## 2014-09-20 NOTE — Telephone Encounter (Signed)
Kathleen Schroeder states that the son would like for the patient to start on something.

## 2014-09-20 NOTE — Telephone Encounter (Signed)
I suggest Remeron 15 mg po qhs and office follow up in one month.  If appetite not improving with that we can consider other options such as Megace.

## 2014-09-20 NOTE — Telephone Encounter (Signed)
Kathleen Schroeder is aware. Rx sent to pharmacy

## 2014-09-25 ENCOUNTER — Other Ambulatory Visit: Payer: Self-pay | Admitting: Family Medicine

## 2014-10-02 ENCOUNTER — Telehealth: Payer: Self-pay | Admitting: Family Medicine

## 2014-10-02 MED ORDER — ESOMEPRAZOLE MAGNESIUM 40 MG PO CPDR: DELAYED_RELEASE_CAPSULE | ORAL | Status: DC

## 2014-10-02 NOTE — Telephone Encounter (Signed)
Pharmacy called for a RX refill on Nexium 40mg .

## 2014-10-02 NOTE — Telephone Encounter (Signed)
Rx sent to pharmacy   

## 2014-10-04 ENCOUNTER — Ambulatory Visit: Payer: Medicare Other | Admitting: Family Medicine

## 2014-10-11 ENCOUNTER — Other Ambulatory Visit: Payer: Self-pay | Admitting: Family Medicine

## 2014-10-11 ENCOUNTER — Other Ambulatory Visit: Payer: Self-pay | Admitting: Internal Medicine

## 2014-10-11 NOTE — Telephone Encounter (Signed)
Refill for 6 months. 

## 2014-10-19 ENCOUNTER — Other Ambulatory Visit: Payer: Self-pay | Admitting: Family Medicine

## 2014-10-22 ENCOUNTER — Ambulatory Visit (INDEPENDENT_AMBULATORY_CARE_PROVIDER_SITE_OTHER): Payer: Medicare Other | Admitting: Family Medicine

## 2014-10-22 ENCOUNTER — Encounter: Payer: Self-pay | Admitting: Family Medicine

## 2014-10-22 ENCOUNTER — Telehealth: Payer: Self-pay | Admitting: Family Medicine

## 2014-10-22 VITALS — BP 88/50 | HR 90 | Temp 97.4°F | Wt 132.0 lb

## 2014-10-22 DIAGNOSIS — E785 Hyperlipidemia, unspecified: Secondary | ICD-10-CM

## 2014-10-22 DIAGNOSIS — R634 Abnormal weight loss: Secondary | ICD-10-CM | POA: Diagnosis not present

## 2014-10-22 DIAGNOSIS — I10 Essential (primary) hypertension: Secondary | ICD-10-CM

## 2014-10-22 DIAGNOSIS — E119 Type 2 diabetes mellitus without complications: Secondary | ICD-10-CM | POA: Diagnosis not present

## 2014-10-22 DIAGNOSIS — R630 Anorexia: Secondary | ICD-10-CM

## 2014-10-22 DIAGNOSIS — R1310 Dysphagia, unspecified: Secondary | ICD-10-CM

## 2014-10-22 LAB — CBC WITH DIFFERENTIAL/PLATELET
Basophils Absolute: 0 10*3/uL (ref 0.0–0.1)
Basophils Relative: 0.4 % (ref 0.0–3.0)
EOS ABS: 0.1 10*3/uL (ref 0.0–0.7)
Eosinophils Relative: 0.8 % (ref 0.0–5.0)
HCT: 29.6 % — ABNORMAL LOW (ref 36.0–46.0)
Hemoglobin: 9.4 g/dL — ABNORMAL LOW (ref 12.0–15.0)
LYMPHS ABS: 0.9 10*3/uL (ref 0.7–4.0)
LYMPHS PCT: 10.5 % — AB (ref 12.0–46.0)
MCHC: 31.7 g/dL (ref 30.0–36.0)
MCV: 78.7 fl (ref 78.0–100.0)
Monocytes Absolute: 0.7 10*3/uL (ref 0.1–1.0)
Monocytes Relative: 8.1 % (ref 3.0–12.0)
NEUTROS ABS: 6.5 10*3/uL (ref 1.4–7.7)
NEUTROS PCT: 80.2 % — AB (ref 43.0–77.0)
Platelets: 514 10*3/uL — ABNORMAL HIGH (ref 150.0–400.0)
RBC: 3.76 Mil/uL — AB (ref 3.87–5.11)
RDW: 16.7 % — AB (ref 11.5–15.5)
WBC: 8.2 10*3/uL (ref 4.0–10.5)

## 2014-10-22 LAB — HEPATIC FUNCTION PANEL
ALT: 13 U/L (ref 0–35)
AST: 20 U/L (ref 0–37)
Albumin: 2.8 g/dL — ABNORMAL LOW (ref 3.5–5.2)
Alkaline Phosphatase: 72 U/L (ref 39–117)
Bilirubin, Direct: 0.2 mg/dL (ref 0.0–0.3)
Total Bilirubin: 0.5 mg/dL (ref 0.2–1.2)
Total Protein: 7.7 g/dL (ref 6.0–8.3)

## 2014-10-22 LAB — LIPID PANEL
CHOL/HDL RATIO: 3
Cholesterol: 82 mg/dL (ref 0–200)
HDL: 26.9 mg/dL — ABNORMAL LOW (ref >39.00)
LDL Cholesterol: 38 mg/dL (ref 0–99)
NONHDL: 55.1
Triglycerides: 88 mg/dL (ref 0.0–149.0)
VLDL: 17.6 mg/dL (ref 0.0–40.0)

## 2014-10-22 LAB — HEMOGLOBIN A1C: Hgb A1c MFr Bld: 7 % — ABNORMAL HIGH (ref 4.6–6.5)

## 2014-10-22 LAB — BASIC METABOLIC PANEL
BUN: 17 mg/dL (ref 6–23)
CHLORIDE: 97 meq/L (ref 96–112)
CO2: 25 mEq/L (ref 19–32)
CREATININE: 1.03 mg/dL (ref 0.40–1.20)
Calcium: 9.7 mg/dL (ref 8.4–10.5)
GFR: 54.51 mL/min — ABNORMAL LOW (ref >60.00)
Glucose, Bld: 236 mg/dL — ABNORMAL HIGH (ref 70–99)
Potassium: 4.1 mEq/L (ref 3.5–5.1)
Sodium: 131 mEq/L — ABNORMAL LOW (ref 135–145)

## 2014-10-22 MED ORDER — BISOPROLOL FUMARATE 5 MG PO TABS: 5.0000 mg | ORAL_TABLET | Freq: Every day | ORAL | Status: DC

## 2014-10-22 MED ORDER — MEGESTROL ACETATE 40 MG PO TABS: 40.0000 mg | ORAL_TABLET | Freq: Two times a day (BID) | ORAL | Status: AC

## 2014-10-22 NOTE — Telephone Encounter (Signed)
Otila Kluver, RN from [facility] called to see if they could extend for another 60 days to work the patient. Please give her call back at [phone]

## 2014-10-22 NOTE — Telephone Encounter (Signed)
Yes.  Agree with this recommendation.

## 2014-10-22 NOTE — Progress Notes (Signed)
Subjective:    Patient ID: Kathleen Schroeder, female    DOB: January 15, 1931, 81 y.o.   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 Other chronic problems include history of type 2 diabetes, hyperlipidemia, hypertension, esophageal stricture. She is accompanied by her daughter today. She's had some weight loss of about 9 pounds since she was here last. Very poor appetite. We had added mirtazapine 15 mg at night but this has not helped much. She has some occasional nausea when eating. No vomiting. Occasional dysphagia to solid foods. They're giving her Boost one daily. She's had occasional confusion. Family has some concerns that she is not taking all of her usual medications regularly.  She has multiple chronic problems as above. They had questions about stopping some of her usual medications. We had recently discontinued her Amaryl secondary to hypoglycemia. She is not monitoring blood sugars regularly. We have suggested that if she has any confusion episodes they should check a blood sugar to make sure she is not hypoglycemic though she is not on anything for diabetes at this time other than metformin.  Past Medical History  Diagnosis Date  . HYPERLIPIDEMIA 12/01/2009  . ESSENTIAL HYPERTENSION 12/16/2008  . ESOPHAGEAL STRICTURE 12/16/2008  . Heartburn 04/08/2008  . DYSPHAGIA 04/08/2008  . Cataract   . Depression   . DIABETES MELLITUS, CONTROLLED 09/01/2009  . GERD (gastroesophageal reflux disease)   . Glaucoma   . Barretts esophagus   . Hyperlipidemia   . Cancer     kidney -right   Past Surgical History  Procedure Laterality Date  . Cholecystectomy    . Abdominal hysterectomy  1984  . Esophagogastroduodenoscopy N/A 10/02/2012    Procedure: ESOPHAGOGASTRODUODENOSCOPY (EGD);  Surgeon: Rogene Houston, MD;  [facility];  Service: Endoscopy;  Laterality: N/A;  . Foreign body removal N/A 10/02/2012    Procedure: FOREIGN BODY  REMOVAL;  Surgeon: Rogene Houston, MD;  [facility]: [facility];  Service: Endoscopy;  Laterality: N/A;  . Laparoscopic nephrectomy Right 08/12/2014    Procedure: RIGHT LAPAROSCOPIC RADICAL NEPHRECTOMY;  Surgeon: Ardis Hughs, MD;  Location: [facility];  Service: Urology;  Laterality: Right;  . Nephrectomy      right    reports that she has never smoked. She has never used smokeless tobacco. She reports that she does not drink alcohol or use illicit drugs. family history includes Cancer in her sister; Stroke in her mother. No Known Allergies    Review of Systems  Constitutional: Positive for fatigue. Negative for fever and chills.  HENT: Negative for voice change.   Respiratory: Positive for cough. Negative for shortness of breath.   Cardiovascular: Negative for chest pain, palpitations and leg swelling.  Gastrointestinal: Positive for nausea. Negative for vomiting, abdominal pain and diarrhea.  Genitourinary: Negative for dysuria.  Hematological: Negative for adenopathy.       Objective:   Physical Exam  Constitutional: She appears well-developed. No distress.  HENT:  Mouth/Throat: Oropharynx is clear and moist.  Neck: Neck supple. No thyromegaly present.  Cardiovascular: Normal rate and regular rhythm.   Pulmonary/Chest: Effort normal and breath sounds normal. No respiratory distress. She has no wheezes. She has no rales.  Abdominal: Soft. There is no tenderness.  Musculoskeletal: She exhibits no edema.  Lymphadenopathy:    She has no cervical adenopathy.  Neurological: She is alert.          Assessment & Plan:  #1 anorexia/  weight loss. She has history of recent diagnosed clear cell cancer the kidney with question of metastases. Question related to this. We'll check basic lab work. Discontinue mirtazapine. Start Megace #2 type 2 diabetes. Recheck A1c. Consider reduction in metformin if well-controlled #3 hypertension. Repeat blood pressure by me left arm seated  88/50. Discontinue amlodipine. Discontinue bisoprolol HCTZ and change to plain bisoprolol. Increase hydration. Consider discontinuation of losartan if blood pressure remains low at follow-up in 3 weeks #4 hyperlipidemia. Repeat lipid and hepatic panel. Consider discontinue simvastatin if appetite and intake remains poor #5 history of GERD with esophageal stricture. Occasional dysphagia. May need GI follow-up

## 2014-10-22 NOTE — Patient Instructions (Signed)
STOP THE NEXIUM STOP THE AMLODIPINE (NORVASC) STOP THE MIRTAZEPINE (REMERON) STOP THE BISOPROLOL-HCTZ  START MEGACE 40 MG TWICE DAILY START BISOPROLOL-PLAIN

## 2014-10-22 NOTE — Progress Notes (Signed)
Pre visit review using our clinic review tool, if applicable. No additional management support is needed unless otherwise documented below in the visit note. 

## 2014-10-23 NOTE — Telephone Encounter (Signed)
Is informed.

## 2014-10-24 ENCOUNTER — Emergency Department (HOSPITAL_COMMUNITY): Payer: Medicare Other

## 2014-10-24 ENCOUNTER — Inpatient Hospital Stay (HOSPITAL_COMMUNITY)
Admission: EM | Admit: 2014-10-24 | Discharge: 2014-10-26 | DRG: 391 | Disposition: A | Discharge status: Home Health | Payer: Medicare Other | Attending: Internal Medicine | Admitting: Internal Medicine

## 2014-10-24 ENCOUNTER — Encounter (HOSPITAL_COMMUNITY): Payer: Self-pay | Admitting: Emergency Medicine

## 2014-10-24 ENCOUNTER — Telehealth: Payer: Self-pay | Admitting: Family Medicine

## 2014-10-24 DIAGNOSIS — C641 Malignant neoplasm of right kidney, except renal pelvis: Secondary | ICD-10-CM | POA: Diagnosis present

## 2014-10-24 DIAGNOSIS — K222 Esophageal obstruction: Secondary | ICD-10-CM | POA: Diagnosis present

## 2014-10-24 DIAGNOSIS — Z905 Acquired absence of kidney: Secondary | ICD-10-CM | POA: Diagnosis present

## 2014-10-24 DIAGNOSIS — R1314 Dysphagia, pharyngoesophageal phase: Secondary | ICD-10-CM | POA: Diagnosis not present

## 2014-10-24 DIAGNOSIS — D649 Anemia, unspecified: Secondary | ICD-10-CM | POA: Diagnosis not present

## 2014-10-24 DIAGNOSIS — E860 Dehydration: Secondary | ICD-10-CM | POA: Diagnosis present

## 2014-10-24 DIAGNOSIS — R531 Weakness: Secondary | ICD-10-CM | POA: Diagnosis not present

## 2014-10-24 DIAGNOSIS — C439 Malignant melanoma of skin, unspecified: Secondary | ICD-10-CM | POA: Diagnosis present

## 2014-10-24 DIAGNOSIS — K3189 Other diseases of stomach and duodenum: Secondary | ICD-10-CM | POA: Diagnosis not present

## 2014-10-24 DIAGNOSIS — K319 Disease of stomach and duodenum, unspecified: Secondary | ICD-10-CM | POA: Diagnosis not present

## 2014-10-24 DIAGNOSIS — Z823 Family history of stroke: Secondary | ICD-10-CM | POA: Diagnosis not present

## 2014-10-24 DIAGNOSIS — K219 Gastro-esophageal reflux disease without esophagitis: Secondary | ICD-10-CM | POA: Diagnosis present

## 2014-10-24 DIAGNOSIS — R4702 Dysphasia: Secondary | ICD-10-CM | POA: Diagnosis present

## 2014-10-24 DIAGNOSIS — E43 Unspecified severe protein-calorie malnutrition: Secondary | ICD-10-CM | POA: Diagnosis present

## 2014-10-24 DIAGNOSIS — E119 Type 2 diabetes mellitus without complications: Secondary | ICD-10-CM | POA: Diagnosis present

## 2014-10-24 DIAGNOSIS — K921 Melena: Secondary | ICD-10-CM | POA: Diagnosis present

## 2014-10-24 DIAGNOSIS — I10 Essential (primary) hypertension: Secondary | ICD-10-CM | POA: Diagnosis present

## 2014-10-24 DIAGNOSIS — R918 Other nonspecific abnormal finding of lung field: Secondary | ICD-10-CM | POA: Diagnosis present

## 2014-10-24 DIAGNOSIS — Q394 Esophageal web: Secondary | ICD-10-CM | POA: Diagnosis not present

## 2014-10-24 DIAGNOSIS — E785 Hyperlipidemia, unspecified: Secondary | ICD-10-CM | POA: Diagnosis present

## 2014-10-24 DIAGNOSIS — R1310 Dysphagia, unspecified: Secondary | ICD-10-CM | POA: Diagnosis not present

## 2014-10-24 DIAGNOSIS — R1110 Vomiting, unspecified: Secondary | ICD-10-CM

## 2014-10-24 DIAGNOSIS — R1319 Other dysphagia: Secondary | ICD-10-CM | POA: Diagnosis present

## 2014-10-24 DIAGNOSIS — Z6822 Body mass index (BMI) 22.0-22.9, adult: Secondary | ICD-10-CM

## 2014-10-24 DIAGNOSIS — E871 Hypo-osmolality and hyponatremia: Secondary | ICD-10-CM | POA: Diagnosis present

## 2014-10-24 DIAGNOSIS — Z90710 Acquired absence of both cervix and uterus: Secondary | ICD-10-CM

## 2014-10-24 HISTORY — DX: Melena: K92.1

## 2014-10-24 HISTORY — DX: Solitary pulmonary nodule: R91.1

## 2014-10-24 HISTORY — DX: Anemia, unspecified: D64.9

## 2014-10-24 HISTORY — DX: Hypo-osmolality and hyponatremia: E87.1

## 2014-10-24 LAB — CBC WITH DIFFERENTIAL/PLATELET
BASOS ABS: 0 10*3/uL (ref 0.0–0.1)
Basophils Relative: 0 % (ref 0–1)
EOS ABS: 0.1 10*3/uL (ref 0.0–0.7)
EOS PCT: 1 % (ref 0–5)
HEMATOCRIT: 27.6 % — AB (ref 36.0–46.0)
Hemoglobin: 8.7 g/dL — ABNORMAL LOW (ref 12.0–15.0)
LYMPHS ABS: 1.2 10*3/uL (ref 0.7–4.0)
LYMPHS PCT: 17 % (ref 12–46)
MCH: 25.3 pg — ABNORMAL LOW (ref 26.0–34.0)
MCHC: 31.5 g/dL (ref 30.0–36.0)
MCV: 80.2 fL (ref 78.0–100.0)
Monocytes Absolute: 0.7 10*3/uL (ref 0.1–1.0)
Monocytes Relative: 11 % (ref 3–12)
Neutro Abs: 4.8 10*3/uL (ref 1.7–7.7)
Neutrophils Relative %: 71 % (ref 43–77)
PLATELETS: 401 10*3/uL — AB (ref 150–400)
RBC: 3.44 MIL/uL — AB (ref 3.87–5.11)
RDW: 15.4 % (ref 11.5–15.5)
WBC: 6.8 10*3/uL (ref 4.0–10.5)

## 2014-10-24 LAB — COMPREHENSIVE METABOLIC PANEL
ALBUMIN: 2.3 g/dL — AB (ref 3.5–5.0)
ALT: 12 U/L — ABNORMAL LOW (ref 14–54)
ANION GAP: 8 (ref 5–15)
AST: 19 U/L (ref 15–41)
Alkaline Phosphatase: 69 U/L (ref 38–126)
BUN: 20 mg/dL (ref 6–20)
CO2: 26 mmol/L (ref 22–32)
CREATININE: 1 mg/dL (ref 0.44–1.00)
Calcium: 9.1 mg/dL (ref 8.9–10.3)
Chloride: 99 mmol/L — ABNORMAL LOW (ref 101–111)
GFR calc Af Amer: 59 mL/min — ABNORMAL LOW (ref >60)
GFR calc non Af Amer: 51 mL/min — ABNORMAL LOW (ref >60)
Glucose, Bld: 186 mg/dL — ABNORMAL HIGH (ref 65–99)
Potassium: 3.8 mmol/L (ref 3.5–5.1)
Sodium: 133 mmol/L — ABNORMAL LOW (ref 135–145)
Total Bilirubin: 0.6 mg/dL (ref 0.3–1.2)
Total Protein: 7.1 g/dL (ref 6.5–8.1)

## 2014-10-24 LAB — GLUCOSE, CAPILLARY
GLUCOSE-CAPILLARY: 130 mg/dL — AB (ref 65–99)
Glucose-Capillary: 159 mg/dL — ABNORMAL HIGH (ref 65–99)

## 2014-10-24 LAB — URINALYSIS, ROUTINE W REFLEX MICROSCOPIC
Bilirubin Urine: NEGATIVE
Glucose, UA: NEGATIVE mg/dL
Ketones, ur: NEGATIVE mg/dL
Nitrite: NEGATIVE
PH: 5.5 (ref 5.0–8.0)
Protein, ur: 30 mg/dL — AB
Specific Gravity, Urine: >1.03 — ABNORMAL HIGH (ref 1.005–1.030)
UROBILINOGEN UA: 2 mg/dL — AB (ref 0.0–1.0)

## 2014-10-24 LAB — URINE MICROSCOPIC-ADD ON

## 2014-10-24 LAB — TSH: TSH: 0.895 u[IU]/mL (ref 0.350–4.500)

## 2014-10-24 LAB — TROPONIN I
Troponin I: <0.03 ng/mL (ref <0.031)
Troponin I: <0.03 ng/mL (ref <0.031)

## 2014-10-24 MED ORDER — ALBUTEROL SULFATE (2.5 MG/3ML) 0.083% IN NEBU: 2.5000 mg | INHALATION_SOLUTION | Freq: Four times a day (QID) | RESPIRATORY_TRACT | Status: DC | PRN

## 2014-10-24 MED ORDER — INSULIN ASPART 100 UNIT/ML ~~LOC~~ SOLN
0.0000 [IU] | Freq: Three times a day (TID) | SUBCUTANEOUS | Status: DC
Start: 2014-10-24 — End: 2014-10-26
  Administered 2014-10-24 – 2014-10-25 (×2): 2 [IU] via SUBCUTANEOUS
  Administered 2014-10-25: 5 [IU] via SUBCUTANEOUS
  Administered 2014-10-26 (×2): 3 [IU] via SUBCUTANEOUS

## 2014-10-24 MED ORDER — SIMVASTATIN 20 MG PO TABS
20.0000 mg | ORAL_TABLET | Freq: Every day | ORAL | Status: DC
  Administered 2014-10-24 – 2014-10-26 (×3): 20 mg via ORAL
  Filled 2014-10-24 (×3): qty 1

## 2014-10-24 MED ORDER — ENSURE ENLIVE PO LIQD
237.0000 mL | Freq: Two times a day (BID) | ORAL | Status: DC
  Administered 2014-10-25: 237 mL via ORAL

## 2014-10-24 MED ORDER — SENNOSIDES-DOCUSATE SODIUM 8.6-50 MG PO TABS: 1.0000 | ORAL_TABLET | Freq: Every evening | ORAL | Status: DC | PRN

## 2014-10-24 MED ORDER — SODIUM CHLORIDE 0.9 % IV BOLUS (SEPSIS)
500.0000 mL | Freq: Once | INTRAVENOUS | Status: AC
  Administered 2014-10-24: 500 mL via INTRAVENOUS

## 2014-10-24 MED ORDER — ONDANSETRON HCL 4 MG PO TABS: 4.0000 mg | ORAL_TABLET | Freq: Four times a day (QID) | ORAL | Status: DC | PRN

## 2014-10-24 MED ORDER — AMLODIPINE BESYLATE 5 MG PO TABS
5.0000 mg | ORAL_TABLET | Freq: Every day | ORAL | Status: DC
  Administered 2014-10-24 – 2014-10-26 (×3): 5 mg via ORAL
  Filled 2014-10-24 (×3): qty 1

## 2014-10-24 MED ORDER — HYDROCODONE-ACETAMINOPHEN 5-325 MG PO TABS: 1.0000 | ORAL_TABLET | ORAL | Status: DC | PRN

## 2014-10-24 MED ORDER — ACETAMINOPHEN 650 MG RE SUPP
650.0000 mg | Freq: Four times a day (QID) | RECTAL | Status: DC | PRN
Start: 2014-10-24 — End: 2014-10-26

## 2014-10-24 MED ORDER — BRIMONIDINE TARTRATE 0.15 % OP SOLN
1.0000 [drp] | Freq: Two times a day (BID) | OPHTHALMIC | Status: DC
  Administered 2014-10-24 – 2014-10-26 (×4): 1 [drp] via OPHTHALMIC
  Filled 2014-10-24: qty 5

## 2014-10-24 MED ORDER — BISACODYL 10 MG RE SUPP: 10.0000 mg | Freq: Every day | RECTAL | Status: DC | PRN

## 2014-10-24 MED ORDER — ONDANSETRON HCL 4 MG/2ML IJ SOLN: 4.0000 mg | Freq: Four times a day (QID) | INTRAMUSCULAR | Status: DC | PRN

## 2014-10-24 MED ORDER — MIRTAZAPINE 15 MG PO TABS
15.0000 mg | ORAL_TABLET | Freq: Every day | ORAL | Status: DC
Start: 2014-10-24 — End: 2014-10-26
  Administered 2014-10-24 – 2014-10-25 (×2): 15 mg via ORAL
  Filled 2014-10-24 (×2): qty 1

## 2014-10-24 MED ORDER — PANTOPRAZOLE SODIUM 40 MG IV SOLR
40.0000 mg | Freq: Once | INTRAVENOUS | Status: AC
  Administered 2014-10-24: 40 mg via INTRAVENOUS
  Filled 2014-10-24: qty 40

## 2014-10-24 MED ORDER — ALUM & MAG HYDROXIDE-SIMETH 200-200-20 MG/5ML PO SUSP: 30.0000 mL | Freq: Four times a day (QID) | ORAL | Status: DC | PRN

## 2014-10-24 MED ORDER — TRAZODONE HCL 50 MG PO TABS
25.0000 mg | ORAL_TABLET | Freq: Every evening | ORAL | Status: DC | PRN
  Administered 2014-10-24: 25 mg via ORAL
  Filled 2014-10-24: qty 1

## 2014-10-24 MED ORDER — PANTOPRAZOLE SODIUM 40 MG IV SOLR
40.0000 mg | INTRAVENOUS | Status: DC
  Administered 2014-10-24 – 2014-10-25 (×2): 40 mg via INTRAVENOUS
  Filled 2014-10-24 (×2): qty 40

## 2014-10-24 MED ORDER — ACETAMINOPHEN 325 MG PO TABS: 650.0000 mg | ORAL_TABLET | Freq: Four times a day (QID) | ORAL | Status: DC | PRN

## 2014-10-24 MED ORDER — INSULIN ASPART 100 UNIT/ML ~~LOC~~ SOLN
0.0000 [IU] | Freq: Every day | SUBCUTANEOUS | Status: DC
  Administered 2014-10-25: 2 [IU] via SUBCUTANEOUS

## 2014-10-24 MED ORDER — SODIUM CHLORIDE 0.9 % IV SOLN
INTRAVENOUS | Status: DC
  Administered 2014-10-24 – 2014-10-25 (×2): via INTRAVENOUS

## 2014-10-24 MED ORDER — DORZOLAMIDE HCL-TIMOLOL MAL 2-0.5 % OP SOLN
1.0000 [drp] | Freq: Two times a day (BID) | OPHTHALMIC | Status: DC
  Administered 2014-10-24 – 2014-10-26 (×4): 1 [drp] via OPHTHALMIC
  Filled 2014-10-24: qty 10

## 2014-10-24 MED ORDER — DOCUSATE SODIUM 100 MG PO CAPS
100.0000 mg | ORAL_CAPSULE | Freq: Two times a day (BID) | ORAL | Status: DC
  Administered 2014-10-24 – 2014-10-26 (×5): 100 mg via ORAL
  Filled 2014-10-24 (×5): qty 1

## 2014-10-24 NOTE — Telephone Encounter (Signed)
Patient Name: Kathleen Schroeder DOB: 1930/05/16 Initial Comment Caller states mother is not eating very much she is vomiting everything. Last night she was not able to walk very weak Nurse Assessment Nurse: Marcelline Deist, RN, Kermit Balo Date/Time (Eastern Time): 10/24/2014 8:19:11 AM Confirm and document reason for call. If symptomatic, describe symptoms. ---Caller states his mother is not eating very much. She is vomiting everything. Last night she was not able to walk, very weak. Had a kidney removed in April. Seen on Tuesday, changed her meds. around, took off of BP rx. Has a home health care nurse. Was in bed yesterday all day. Has the patient traveled out of the country within the last 30 days? ---Not Applicable Does the patient require triage? ---Yes Related visit to physician within the last 2 weeks? ---Yes Does the PT have any chronic conditions? (i.e. diabetes, asthma, etc.) ---Yes List chronic conditions. ---diabetes, kidney removed from cancer Guidelines Guideline Title Affirmed Question Affirmed Notes Weakness (Generalized) and Fatigue [1] MODERATE weakness (i.e., interferes with work, school, normal activities) AND [2] cause unknown (Exceptions: weakness with acute minor illness, or weakness from poor fluid intake) Final Disposition User See Physician within 4 Hours (or PCP triage) Marcelline Deist, RN, Lynda Comments Caller is not with his mother at this time. He feels she is most likely dehydrated & needs to be seen in the ER vs. the office. He will go speak with his mother to determine where to bring her. He will call the office back if she refuses to be seen in the ER & wants to come to office.

## 2014-10-24 NOTE — H&P (Signed)
[facility] History and Physical  Kathleen Schroeder:[id number] DOB: [birth year] DOA: 10/24/2014  Referring physician: zammitt PCP: Eulas Post, MD   Chief Complaint: Delman Kitten  HPI: Kathleen Schroeder is a very pleasant 81 y.o. female with a past medical history includes renal cancer status post nephrectomy 4 weeks ago, diabetes, hypertension, Barrett's esophagus, dysphasia last E GD 2013 presents to emergency department with chief complaint of gradual worsening of generalized weakness. Initial evaluation in the emergency department reveals anemia hemoglobin 8.7 hyponatremia heme positive stools.  Patient reports she has not been herself since she had her nephrectomy 4 weeks ago. Her energy level waxes and wanes and her ability to take in fluid and nourishment waxes and wanes as well. Past week she reports sensation of food getting "stuck in my chest" and when she chews she develops nausea. She denies difficulty chewing or swallowing pain with chewing or swallowing or coughing with eating or drinking. She denies abdominal pain endorses 2 episodes of emesis several days ago. She denies coffee ground emesis. She does endorse melanoma. She does not take Pepto-Bismol or iron supplements. She denies a chest pain dizziness visual disturbances syncope or near-syncope. She denies shortness of breath. Does report last night she was so weak she was unable to bear weight. She denies dysuria hematuria frequency or urgency. In the emergency department workup significant for hemoglobin 8.7 platelets 401 sodium 133 chloride 99 serum glucose 186. Rest x-ray reveals moderate stool throughout colon, cardiomegaly, central pulmonary vascular prominence, pulmonary nodule suggestive of pulmonary metastatic disease. She is hemodynamically stable and not hypoxic. She is given 40 mg of Protonix and 500 mL of normal saline.   Review of Systems:  10 point review of systems complete and all systems are  negative except as indicated in the history of present illness  Past Medical History  Diagnosis Date  . HYPERLIPIDEMIA 12/01/2009  . ESSENTIAL HYPERTENSION 12/16/2008  . ESOPHAGEAL STRICTURE 12/16/2008  . Heartburn 04/08/2008  . DYSPHAGIA 04/08/2008  . Cataract   . Depression   . DIABETES MELLITUS, CONTROLLED 09/01/2009  . GERD (gastroesophageal reflux disease)   . Glaucoma   . Barretts esophagus   . Hyperlipidemia   . Cancer     kidney -right  . Anemia   . Melena   . Hyponatremia   . Pulmonary nodule    Past Surgical History  Procedure Laterality Date  . Cholecystectomy    . Abdominal hysterectomy  1984  . Esophagogastroduodenoscopy N/A 10/02/2012    Procedure: ESOPHAGOGASTRODUODENOSCOPY (EGD);  Surgeon: Rogene Houston, MD;  Location: [facility];  Service: Endoscopy;  Laterality: N/A;  . Foreign body removal N/A 10/02/2012    Procedure: FOREIGN BODY REMOVAL;  Surgeon: Rogene Houston, MD;  Location: [facility];  Service: Endoscopy;  Laterality: N/A;  . Laparoscopic nephrectomy Right 08/12/2014    Procedure: RIGHT LAPAROSCOPIC RADICAL NEPHRECTOMY;  Surgeon: Ardis Hughs, MD;  Location: [facility];  Service: Urology;  Laterality: Right;  . Nephrectomy      right   Social History:  reports that she has never smoked. She has never used smokeless tobacco. She reports that she does not drink alcohol or use illicit drugs. He lives at home with her husband and has done so for the last 60 years. He is fairly independent with ADLs No Known Allergies  Family History  Problem Relation Age of Onset  . Stroke Mother   . Cancer Sister     brain, breast  Prior to Admission medications   Medication Sig Start Date End Date Taking? Authorizing Provider  albuterol (PROVENTIL HFA;VENTOLIN HFA) 108 (90 BASE) MCG/ACT inhaler Inhale 1-2 puffs into the lungs every 6 (six) hours as needed for wheezing or shortness of breath. 05/19/14  Yes Nat Christen, MD  amLODipine (NORVASC) 5 MG tablet  Take 1 tablet by mouth daily. 10/11/14  Yes Historical Provider, MD  bisoprolol-hydrochlorothiazide (ZIAC) 5-6.25 MG per tablet Take 1 tablet by mouth daily.   Yes Historical Provider, MD  brimonidine (ALPHAGAN) 0.15 % ophthalmic solution Place 1 drop into both eyes 2 (two) times daily.  09/28/11  Yes Historical Provider, MD  dorzolamide-timolol (COSOPT) 22.3-6.8 MG/ML ophthalmic solution Place 1 drop into both eyes 2 (two) times daily.  09/28/11  Yes Historical Provider, MD  esomeprazole (NEXIUM) 40 MG capsule Take 1 capsule by mouth daily.  10/02/14  Yes Historical Provider, MD  losartan (COZAAR) 50 MG tablet Take 1 tablet (50 mg total) by mouth daily. 07/23/14  Yes Eulas Post, MD  metFORMIN (GLUCOPHAGE) 500 MG tablet TAKE (2) TABLETS TWICE DAILY (MORNING AND EVEN- ING). 10/11/14  Yes Eulas Post, MD  mirtazapine (REMERON) 15 MG tablet Take 1 tablet by mouth at bedtime.  10/14/14  Yes Historical Provider, MD  ACCU-CHEK AVIVA PLUS test strip Check 1 time daily. E11.9 09/25/14   Eulas Post, MD  ACCU-CHEK SOFTCLIX LANCETS lancets USE AS DIRECTED 09/17/14   Eulas Post, MD  megestrol (MEGACE) 40 MG tablet Take 1 tablet (40 mg total) by mouth 2 (two) times daily. Patient not taking: Reported on 10/24/2014 10/22/14   Eulas Post, MD  simvastatin (ZOCOR) 20 MG tablet Take 1 tablet (20 mg total) by mouth daily. 07/23/14   Eulas Post, MD   Physical Exam: Filed Vitals:   10/24/14 1009 10/24/14 1030 10/24/14 1125 10/24/14 1335  BP: 103/82 117/57 120/60 135/60  Pulse: 77 93 90 90  Temp: 97.5 F (36.4 C)  98.3 F (36.8 C) 98 F (36.7 C)  TempSrc:   Oral Oral  Resp: 27 29 24 24   Height: 5\' 5"  (1.651 m)     Weight: 59.875 kg (132 lb)     SpO2: 97% 94% 96% 96%    Wt Readings from Last 3 Encounters:  10/24/14 59.875 kg (132 lb)  10/22/14 59.875 kg (132 lb)  09/03/14 63.957 kg (141 lb)    General:  Appears somewhat frail but comfortable Eyes: PERRL, normal lids, irises &  conjunctiva ENT: grossly normal hearing, lips & tongue because membranes of her mouth are dry slightly pale Neck: no LAD, masses or thyromegaly Cardiovascular: RRR, no m/r/g. No LE edema. Respiratory: CTA bilaterally, no w/r/r. Normal respiratory effort. Abdomen: soft, ntnd positive bowel sounds no guarding Skin: no rash or induration seen on limited exam Musculoskeletal: grossly normal tone BUE/BLE Psychiatric: grossly normal mood and affect, speech fluent and appropriate Neurologic: grossly non-focal. Speech clear           Labs on Admission:  Basic Metabolic Panel:  Recent Labs Lab 10/22/14 1008 10/24/14 1013  NA 131* 133*  K 4.1 3.8  CL 97 99*  CO2 25 26  GLUCOSE 236* 186*  BUN 17 20  CREATININE 1.03 1.00  CALCIUM 9.7 9.1   Liver Function Tests:  Recent Labs Lab 10/22/14 1008 10/24/14 1013  AST 20 19  ALT 13 12*  ALKPHOS 72 69  BILITOT 0.5 0.6  PROT 7.7 7.1  ALBUMIN 2.8* 2.3*   No results  for input(s): LIPASE, AMYLASE in the last 168 hours. No results for input(s): AMMONIA in the last 168 hours. CBC:  Recent Labs Lab 10/22/14 1008 10/24/14 1013  WBC 8.2 6.8  NEUTROABS 6.5 4.8  HGB 9.4* 8.7*  HCT 29.6* 27.6*  MCV 78.7 80.2  PLT 514.0* 401*   Cardiac Enzymes:  Recent Labs Lab 10/24/14 1013  TROPONINI <0.03    BNP (last 3 results) No results for input(s): BNP in the last 8760 hours.  ProBNP (last 3 results) No results for input(s): PROBNP in the last 8760 hours.  CBG: No results for input(s): GLUCAP in the last 168 hours.  Radiological Exams on Admission: Dg Abd Acute W/chest  10/24/2014   CLINICAL DATA:  81 year old hypertensive diabetic female with vomiting and weakness. History of renal cell carcinoma. Subsequent encounter.  EXAM: DG ABDOMEN ACUTE W/ 1V CHEST  COMPARISON:  08/29/2014 chest x-ray. 06/06/2014 CT chest trauma chest, abdomen and pelvis.  FINDINGS: Cardiomegaly.  Calcified tortuous aorta.  Pulmonary vascular prominence most  notable centrally.  Increased number and size of pulmonary nodules suggestive of metastatic disease.  Moderate stool throughout colon without plain film evidence of bowel obstruction or free intraperitoneal.  IMPRESSION: Moderate stool throughout colon without plain film evidence of bowel obstruction or free intraperitoneal.  Pulmonary nodule suggestive of pulmonary metastatic disease.  Cardiomegaly.  Central pulmonary vascular prominence.   Electronically Signed   By: Genia Del M.D.   On: 10/24/2014 11:16    EKG: Independently reviewed. Sinus rhythm with right bundle branch block  Assessment/Plan Principal Problem:   Weakness: Likely multifactorial i.e. decreased by mouth intake related to dysphagia and nausea in setting of dehydration and anemia. Will admit to MedSurg. Will provide full liquid diet and request GI consult as patient complains of dysphagia and she has a history of esophageal stricture last EGD done in 2013. Patient also with heme positive stools in the emergency department and a hemoglobin of 8.7 down from 9.4 2 days ago. Will request GI input. Obtain serial CBCs. Provide supportive therapy. Until you Remeron. Get physical therapy consult. Monitor closely Active Problems: Melana: She denies NSAID use. Unsure of last colonoscopy. Heme positive stools in the emergency department. See #1.  Anemia: Baseline 11 and hemoglobin less than that and patient is dehydrated. appears to be 9-10. Continue proton X rest GI assistance  Dysphagia: Patient complains of sensation of food getting stuck in her chest. History of esophageal stricture. Chart review indicates last EGD done 2013. Will provide full liquid diet and request GI consult. Reports this is the main reason she is not eating and drinking    Pulmonary nodules: Concern for metastatic disease. Outpatient follow-up    Hyponatremia: Mild. Provide IV fluids. Monitor closely     Type 2 diabetes mellitus, controlled: recent A1c 7.0.  Will hold her oral agents and use sliding scale insulin.    Hyperlipidemia: Recent lipid panel unremarkable continue home meds    Essential hypertension: Fair control. Will hold her CTZ monitor  Renal Cancer status post right nephrectomy 4 weeks ago. Creatinine 1.00 BUN 20. Will monitor intake and output. Incision well-healed        code Status: full DVT Prophylaxis: Family Communication: none present Disposition Plan: home when ready hopefully 24-48 hours  Time spent: [facility] Pager [phone]

## 2014-10-24 NOTE — ED Notes (Signed)
Pt brought in with family with co generalized weakness today - per son last evening pt had inability to ambulate - stated that she had no movement in her legs ( "her legs just hung there " ) EMS not called as pt stated that she would refuse transportation - Was lifted back into bed - this am son states that pt has her usual weakness , they called her MD instructed to come to ER for evaluation of "possible dehyrdration"

## 2014-10-24 NOTE — ED Provider Notes (Signed)
CSN: [id number]     Arrival date & time 10/24/14  6063 History  This chart was scribed for Milton Ferguson, MD by Rayna Sexton, ED scribe. This patient was seen in room APA01/APA01 and the patient's care was started at 10:14 AM.    Chief Complaint  Patient presents with  . Fatigue   Patient is a 81 y.o. female presenting with weakness. The history is provided by the patient and a relative. No language interpreter was used.  Weakness This is a new problem. The current episode started 2 days ago. The problem occurs constantly. The problem has not changed since onset.Pertinent negatives include no chest pain, no abdominal pain and no headaches. Nothing aggravates the symptoms. Nothing relieves the symptoms. She has tried nothing for the symptoms.    HPI Comments: Kathleen Schroeder is a 81 y.o. female who presents to the Emergency Department complaining of generalized, moderate, weakness with onset 2 days ago. She notes an intermittent ability to ambulate and nurses note she was able to get herself into bed s/p arrival to the ED. Pt notes associated, intermittent, dizziness, nausea, vomiting, and diarrhea. Pt's relative notes the intermitted nausea and vomiting began s/p a recent nephrectomy. Her son further notes he called her PCP this morning who instructed them to bring her to the ED due to possible dehydration. Pt denies any myalgias or abd pain.   Past Medical History  Diagnosis Date  . HYPERLIPIDEMIA 12/01/2009  . ESSENTIAL HYPERTENSION 12/16/2008  . ESOPHAGEAL STRICTURE 12/16/2008  . Heartburn 04/08/2008  . DYSPHAGIA 04/08/2008  . Cataract   . Depression   . DIABETES MELLITUS, CONTROLLED 09/01/2009  . GERD (gastroesophageal reflux disease)   . Glaucoma   . Barretts esophagus   . Hyperlipidemia   . Cancer     kidney -right   Past Surgical History  Procedure Laterality Date  . Cholecystectomy    . Abdominal hysterectomy  1984  . Esophagogastroduodenoscopy N/A 10/02/2012    Procedure:  ESOPHAGOGASTRODUODENOSCOPY (EGD);  Surgeon: Rogene Houston, MD;  Location: [facility];  Service: Endoscopy;  Laterality: N/A;  . Foreign body removal N/A 10/02/2012    Procedure: FOREIGN BODY REMOVAL;  Surgeon: Rogene Houston, MD;  Location: [facility];  Service: Endoscopy;  Laterality: N/A;  . Laparoscopic nephrectomy Right 08/12/2014    Procedure: RIGHT LAPAROSCOPIC RADICAL NEPHRECTOMY;  Surgeon: Ardis Hughs, MD;  Location: [facility];  Service: Urology;  Laterality: Right;  . Nephrectomy      right   Family History  Problem Relation Age of Onset  . Stroke Mother   . Cancer Sister     brain, breast   History  Substance Use Topics  . Smoking status: Never Smoker   . Smokeless tobacco: Never Used  . Alcohol Use: No   OB History    No data available     Review of Systems  Constitutional: Positive for activity change. Negative for appetite change and fatigue.  HENT: Negative for congestion, ear discharge and sinus pressure.   Eyes: Negative for discharge.  Respiratory: Negative for cough.   Cardiovascular: Negative for chest pain.  Gastrointestinal: Positive for nausea, vomiting and diarrhea. Negative for abdominal pain and constipation.  Genitourinary: Negative for frequency and hematuria.  Musculoskeletal: Negative for myalgias and back pain.  Skin: Negative for rash.  Neurological: Positive for dizziness and weakness. Negative for seizures and headaches.  Psychiatric/Behavioral: Negative for hallucinations.    Allergies  Review of patient's allergies indicates no known allergies.  Home Medications   Prior to Admission medications   Medication Sig Start Date End Date Taking? Authorizing Provider  ACCU-CHEK AVIVA PLUS test strip Check 1 time daily. E11.9 09/25/14   Eulas Post, MD  ACCU-CHEK SOFTCLIX LANCETS lancets USE AS DIRECTED 09/17/14   Eulas Post, MD  albuterol (PROVENTIL HFA;VENTOLIN HFA) 108 (90 BASE) MCG/ACT inhaler Inhale 1-2 puffs into the  lungs every 6 (six) hours as needed for wheezing or shortness of breath. 05/19/14   Nat Christen, MD  bisoprolol (ZEBETA) 5 MG tablet Take 1 tablet (5 mg total) by mouth daily. 10/22/14   Eulas Post, MD  brimonidine (ALPHAGAN) 0.15 % ophthalmic solution Place 1 drop into both eyes 2 (two) times daily.  09/28/11   Historical Provider, MD  dorzolamide-timolol (COSOPT) 22.3-6.8 MG/ML ophthalmic solution Place 1 drop into both eyes 2 (two) times daily.  09/28/11   Historical Provider, MD  losartan (COZAAR) 50 MG tablet Take 1 tablet (50 mg total) by mouth daily. 07/23/14   Eulas Post, MD  megestrol (MEGACE) 40 MG tablet Take 1 tablet (40 mg total) by mouth 2 (two) times daily. 10/22/14   Eulas Post, MD  metFORMIN (GLUCOPHAGE) 500 MG tablet TAKE (2) TABLETS TWICE DAILY (MORNING AND EVEN- ING). 10/11/14   Eulas Post, MD  ondansetron (ZOFRAN) 4 MG tablet Take 1 tablet (4 mg total) by mouth every 8 (eight) hours as needed for nausea or vomiting. 08/16/14   Ardis Hughs, MD  simvastatin (ZOCOR) 20 MG tablet Take 1 tablet (20 mg total) by mouth daily. 07/23/14   Eulas Post, MD  traMADol Veatrice Bourbon) 50 MG tablet Take one or two tablets by mouth every 6 hours as needed for moderate pain 08/19/14   [place], DO   There were no vitals taken for this visit. Physical Exam  Constitutional: She is oriented to person, place, and time. She appears well-developed.  General weakness and ambulates slowly  HENT:  Head: Normocephalic.  Dry mucus membranes  Eyes: Conjunctivae and EOM are normal. No scleral icterus.  Neck: Neck supple. No thyromegaly present.  Cardiovascular: Normal rate and regular rhythm.  Exam reveals no gallop and no friction rub.   No murmur heard. Pulmonary/Chest: No stridor. She has no wheezes. She has no rales. She exhibits no tenderness.  Abdominal: She exhibits no distension. There is no tenderness. There is no rebound.  Genitourinary:  Hem pos stool   Musculoskeletal: Normal range of motion. She exhibits no edema.  Lymphadenopathy:    She has no cervical adenopathy.  Neurological: She is oriented to person, place, and time. She exhibits normal muscle tone. Coordination normal.  Skin: No rash noted. No erythema.  Psychiatric: She has a normal mood and affect. Her behavior is normal.    ED Course  Procedures  DIAGNOSTIC STUDIES: Oxygen Saturation is 97% on RA, normal by my interpretation.    COORDINATION OF CARE: 10:18 AM Discussed treatment plan with pt at bedside and pt agreed to plan.  Labs Review Labs Reviewed - No data to display  Imaging Review No results found.   EKG Interpretation None      MDM   Final diagnoses:  None   Pt with persistent vomiting and weakness,  Admit for possible metastatic dz, dehydration,  Will get ct chest  The chart was scribed for me under my direct supervision.  I personally performed the history, physical, and medical decision making and all procedures in the evaluation of  this patient.Milton Ferguson, MD 10/24/14 [phone]

## 2014-10-24 NOTE — Telephone Encounter (Signed)
Pt son informed that they did take patient to the ER.

## 2014-10-24 NOTE — ED Notes (Signed)
Pt to go to 301 after going to CT. CT aware. Report called to [facility]

## 2014-10-24 NOTE — Telephone Encounter (Signed)
Per Dr. Jacinto Reap. Patient needs to go to the ER. Left message on son Rennis Petty.

## 2014-10-25 ENCOUNTER — Telehealth: Payer: Self-pay | Admitting: Family Medicine

## 2014-10-25 ENCOUNTER — Encounter (HOSPITAL_COMMUNITY)
Admission: EM | Disposition: A | Discharge status: Home Health | Payer: Self-pay | Source: Home / Self Care | Attending: Internal Medicine

## 2014-10-25 ENCOUNTER — Encounter (HOSPITAL_COMMUNITY): Payer: Self-pay | Admitting: *Deleted

## 2014-10-25 DIAGNOSIS — K921 Melena: Secondary | ICD-10-CM

## 2014-10-25 DIAGNOSIS — K3189 Other diseases of stomach and duodenum: Secondary | ICD-10-CM | POA: Insufficient documentation

## 2014-10-25 DIAGNOSIS — R531 Weakness: Secondary | ICD-10-CM

## 2014-10-25 DIAGNOSIS — K319 Disease of stomach and duodenum, unspecified: Secondary | ICD-10-CM | POA: Insufficient documentation

## 2014-10-25 DIAGNOSIS — R1310 Dysphagia, unspecified: Secondary | ICD-10-CM

## 2014-10-25 DIAGNOSIS — K222 Esophageal obstruction: Secondary | ICD-10-CM | POA: Insufficient documentation

## 2014-10-25 DIAGNOSIS — Q394 Esophageal web: Secondary | ICD-10-CM

## 2014-10-25 DIAGNOSIS — D649 Anemia, unspecified: Secondary | ICD-10-CM

## 2014-10-25 HISTORY — PX: ESOPHAGEAL DILATION: SHX303

## 2014-10-25 HISTORY — PX: ESOPHAGOGASTRODUODENOSCOPY: SHX5428

## 2014-10-25 LAB — CBC
HCT: 25.3 % — ABNORMAL LOW (ref 36.0–46.0)
Hemoglobin: 8.1 g/dL — ABNORMAL LOW (ref 12.0–15.0)
MCH: 25.4 pg — ABNORMAL LOW (ref 26.0–34.0)
MCHC: 32 g/dL (ref 30.0–36.0)
MCV: 79.3 fL (ref 78.0–100.0)
PLATELETS: 428 10*3/uL — AB (ref 150–400)
RBC: 3.19 MIL/uL — ABNORMAL LOW (ref 3.87–5.11)
RDW: 15.4 % (ref 11.5–15.5)
WBC: 6.7 10*3/uL (ref 4.0–10.5)

## 2014-10-25 LAB — BASIC METABOLIC PANEL
ANION GAP: 8 (ref 5–15)
BUN: 13 mg/dL (ref 6–20)
CHLORIDE: 101 mmol/L (ref 101–111)
CO2: 24 mmol/L (ref 22–32)
CREATININE: 0.87 mg/dL (ref 0.44–1.00)
Calcium: 8.4 mg/dL — ABNORMAL LOW (ref 8.9–10.3)
GFR calc Af Amer: >60 mL/min (ref >60)
GFR calc non Af Amer: >60 mL/min (ref >60)
GLUCOSE: 164 mg/dL — AB (ref 65–99)
POTASSIUM: 3.7 mmol/L (ref 3.5–5.1)
SODIUM: 133 mmol/L — AB (ref 135–145)

## 2014-10-25 LAB — GLUCOSE, CAPILLARY
GLUCOSE-CAPILLARY: 206 mg/dL — AB (ref 65–99)
GLUCOSE-CAPILLARY: 197 mg/dL — AB (ref 65–99)
GLUCOSE-CAPILLARY: 203 mg/dL — AB (ref 65–99)
Glucose-Capillary: 150 mg/dL — ABNORMAL HIGH (ref 65–99)
Glucose-Capillary: 118 mg/dL — ABNORMAL HIGH (ref 65–99)

## 2014-10-25 LAB — HEMOGLOBIN A1C
Hgb A1c MFr Bld: 7.1 % — ABNORMAL HIGH (ref 4.8–5.6)
Mean Plasma Glucose: 157 mg/dL

## 2014-10-25 LAB — TROPONIN I

## 2014-10-25 SURGERY — EGD (ESOPHAGOGASTRODUODENOSCOPY)
Anesthesia: Moderate Sedation

## 2014-10-25 MED ORDER — SODIUM CHLORIDE 0.9 % IV SOLN: INTRAVENOUS | Status: DC

## 2014-10-25 MED ORDER — MEPERIDINE HCL 100 MG/ML IJ SOLN
INTRAMUSCULAR | Status: AC
  Filled 2014-10-25: qty 2

## 2014-10-25 MED ORDER — MIDAZOLAM HCL 5 MG/5ML IJ SOLN
INTRAMUSCULAR | Status: DC | PRN
Start: 2014-10-25 — End: 2014-10-25
  Administered 2014-10-25: 2 mg via INTRAVENOUS
  Administered 2014-10-25: 1 mg via INTRAVENOUS

## 2014-10-25 MED ORDER — ONDANSETRON HCL 4 MG/2ML IJ SOLN
INTRAMUSCULAR | Status: AC
  Filled 2014-10-25: qty 2

## 2014-10-25 MED ORDER — LIDOCAINE VISCOUS 2 % MT SOLN
OROMUCOSAL | Status: AC
  Filled 2014-10-25: qty 15

## 2014-10-25 MED ORDER — MEPERIDINE HCL 100 MG/ML IJ SOLN
INTRAMUSCULAR | Status: DC | PRN
  Administered 2014-10-25: 25 mg via INTRAVENOUS

## 2014-10-25 MED ORDER — SIMETHICONE 40 MG/0.6ML PO SUSP
ORAL | Status: DC | PRN
Start: 2014-10-25 — End: 2014-10-25
  Administered 2014-10-25: 14:00:00

## 2014-10-25 MED ORDER — MIDAZOLAM HCL 5 MG/5ML IJ SOLN
INTRAMUSCULAR | Status: AC
  Filled 2014-10-25: qty 10

## 2014-10-25 MED ORDER — ONDANSETRON HCL 4 MG/2ML IJ SOLN
INTRAMUSCULAR | Status: DC | PRN
Start: 2014-10-25 — End: 2014-10-25
  Administered 2014-10-25: 4 mg via INTRAVENOUS

## 2014-10-25 MED ORDER — LIDOCAINE VISCOUS 2 % MT SOLN
OROMUCOSAL | Status: DC | PRN
  Administered 2014-10-25: 3 mL via OROMUCOSAL

## 2014-10-25 NOTE — Telephone Encounter (Signed)
[name] call from [employer] to say that Kathleen Schroeder is approve for one year

## 2014-10-25 NOTE — Telephone Encounter (Signed)
Pharmacy is aware

## 2014-10-25 NOTE — Consult Note (Addendum)
Referring Provider: No ref. provider found Primary Care Physician:  Eulas Post, MD Primary Gastroenterologist:  Dr. Laural Golden  Date of Admission: 10/24/14 Date of Consultation: 10/25/14  Reason for Consultation:  Anemia, heme+ stool, dysphagia  HPI:  81 year old female with a PMH of renal cancer s/po nephrectomy 4 weeks ago, dysphagia, Barrett's esophagus. Se presented to teh ED for worsening generalized weakness. She has a history of anemia with baseline hgb 10.8-13.2. On presentation to the ER her hgb was found to be 8.7 along with heme+ stools. She also complains of return of dysphagia symptoms within the past month. She had an endoscopy in 2014 for esophageal stricture/dysphagia and removal of foreign body. Patient states her dysphagia symptoms improved afterward. Also of note her CXR showed possible metastatic tumors. She has had a decrease in appetitie partly because "food isn't as appetizing" and partly because of dysphagia symptoms. Has a history of GERD as well which is controlled with Nexium. Her last colonoscopy is not found in the system. She denies fever, chills, hematochezia. She states her stools have been "dark or black for quite a while now." Continued weakness. Denies chest pain, dyspnea. Denies any other upper or lower GI symptoms.     Past Medical History  Diagnosis Date  . HYPERLIPIDEMIA 12/01/2009  . ESSENTIAL HYPERTENSION 12/16/2008  . ESOPHAGEAL STRICTURE 12/16/2008  . Heartburn 04/08/2008  . DYSPHAGIA 04/08/2008  . Cataract   . Depression   . DIABETES MELLITUS, CONTROLLED 09/01/2009  . GERD (gastroesophageal reflux disease)   . Glaucoma   . Barretts esophagus   . Hyperlipidemia   . Cancer     kidney -right  . Anemia   . Melena   . Hyponatremia   . Pulmonary nodule     Past Surgical History  Procedure Laterality Date  . Cholecystectomy    . Abdominal hysterectomy  1984  . Esophagogastroduodenoscopy N/A 10/02/2012    Procedure: ESOPHAGOGASTRODUODENOSCOPY  (EGD);  Surgeon: Rogene Houston, MD;  Location: [facility];  Service: Endoscopy;  Laterality: N/A;  . Foreign body removal N/A 10/02/2012    Procedure: FOREIGN BODY REMOVAL;  Surgeon: Rogene Houston, MD;  Location: [facility];  Service: Endoscopy;  Laterality: N/A;  . Laparoscopic nephrectomy Right 08/12/2014    Procedure: RIGHT LAPAROSCOPIC RADICAL NEPHRECTOMY;  Surgeon: Ardis Hughs, MD;  Location: [facility];  Service: Urology;  Laterality: Right;  . Nephrectomy      right    Prior to Admission medications   Medication Sig Start Date End Date Taking? Authorizing Provider  albuterol (PROVENTIL HFA;VENTOLIN HFA) 108 (90 BASE) MCG/ACT inhaler Inhale 1-2 puffs into the lungs every 6 (six) hours as needed for wheezing or shortness of breath. 05/19/14  Yes Nat Christen, MD  amLODipine (NORVASC) 5 MG tablet Take 1 tablet by mouth daily. 10/11/14  Yes Historical Provider, MD  bisoprolol-hydrochlorothiazide (ZIAC) 5-6.25 MG per tablet Take 1 tablet by mouth daily.   Yes Historical Provider, MD  brimonidine (ALPHAGAN) 0.15 % ophthalmic solution Place 1 drop into both eyes 2 (two) times daily.  09/28/11  Yes Historical Provider, MD  dorzolamide-timolol (COSOPT) 22.3-6.8 MG/ML ophthalmic solution Place 1 drop into both eyes 2 (two) times daily.  09/28/11  Yes Historical Provider, MD  esomeprazole (NEXIUM) 40 MG capsule Take 1 capsule by mouth daily.  10/02/14  Yes Historical Provider, MD  losartan (COZAAR) 50 MG tablet Take 1 tablet (50 mg total) by mouth daily. 07/23/14  Yes Eulas Post, MD  metFORMIN (GLUCOPHAGE)  500 MG tablet TAKE (2) TABLETS TWICE DAILY (MORNING AND EVEN- ING). 10/11/14  Yes Eulas Post, MD  mirtazapine (REMERON) 15 MG tablet Take 1 tablet by mouth at bedtime.  10/14/14  Yes Historical Provider, MD  ACCU-CHEK AVIVA PLUS test strip Check 1 time daily. E11.9 09/25/14   Eulas Post, MD  ACCU-CHEK SOFTCLIX LANCETS lancets USE AS DIRECTED 09/17/14   Eulas Post, MD   megestrol (MEGACE) 40 MG tablet Take 1 tablet (40 mg total) by mouth 2 (two) times daily. Patient not taking: Reported on 10/24/2014 10/22/14   Eulas Post, MD  simvastatin (ZOCOR) 20 MG tablet Take 1 tablet (20 mg total) by mouth daily. 07/23/14   Eulas Post, MD    Current Facility-Administered Medications  Medication Dose Route Frequency Provider Last Rate Last Dose  . 0.9 %  sodium chloride infusion   Intravenous Continuous Radene Gunning, NP 75 mL/hr at 10/25/14 0330    . acetaminophen (TYLENOL) tablet 650 mg  650 mg Oral Q6H PRN Radene Gunning, NP       Or  . acetaminophen (TYLENOL) suppository 650 mg  650 mg Rectal Q6H PRN Radene Gunning, NP      . albuterol (PROVENTIL) (2.5 MG/3ML) 0.083% nebulizer solution 2.5 mg  2.5 mg Inhalation Q6H PRN Radene Gunning, NP      . alum & mag hydroxide-simeth (MAALOX/MYLANTA) 200-200-20 MG/5ML suspension 30 mL  30 mL Oral Q6H PRN Radene Gunning, NP      . amLODipine (NORVASC) tablet 5 mg  5 mg Oral Daily Radene Gunning, NP   5 mg at 10/25/14 1423  . bisacodyl (DULCOLAX) suppository 10 mg  10 mg Rectal Daily PRN Radene Gunning, NP      . brimonidine (ALPHAGAN) 0.15 % ophthalmic solution 1 drop  1 drop Both Eyes BID Radene Gunning, NP   1 drop at 10/25/14 [phone]  . docusate sodium (COLACE) capsule 100 mg  100 mg Oral BID Radene Gunning, NP   100 mg at 10/25/14 0233  . dorzolamide-timolol (COSOPT) 22.3-6.8 MG/ML ophthalmic solution 1 drop  1 drop Both Eyes BID Radene Gunning, NP   1 drop at 10/25/14 4356  . feeding supplement (ENSURE ENLIVE) (ENSURE ENLIVE) liquid 237 mL  237 mL Oral BID BM Orvan Falconer, MD   237 mL at 10/25/14 0924  . HYDROcodone-acetaminophen (NORCO/VICODIN) 5-325 MG per tablet 1-2 tablet  1-2 tablet Oral Q4H PRN Radene Gunning, NP      . insulin aspart (novoLOG) injection 0-15 Units  0-15 Units Subcutaneous TID WC Radene Gunning, NP   2 Units at 10/25/14 [phone]  . insulin aspart (novoLOG) injection 0-5 Units  0-5 Units Subcutaneous QHS Radene Gunning, NP   0 Units at 10/24/14 2200  . mirtazapine (REMERON) tablet 15 mg  15 mg Oral QHS Radene Gunning, NP   15 mg at 10/24/14 2229  . ondansetron (ZOFRAN) tablet 4 mg  4 mg Oral Q6H PRN Radene Gunning, NP       Or  . ondansetron [facility]) injection 4 mg  4 mg Intravenous Q6H PRN Radene Gunning, NP      . pantoprazole (PROTONIX) injection 40 mg  40 mg Intravenous Q24H Radene Gunning, NP   40 mg at 10/24/14 1548  . senna-docusate (Senokot-S) tablet 1 tablet  1 tablet Oral QHS PRN Radene Gunning, NP      . simvastatin (ZOCOR)  tablet 20 mg  20 mg Oral Daily Radene Gunning, NP   20 mg at 10/25/14 9381  . traZODone (DESYREL) tablet 25 mg  25 mg Oral QHS PRN Radene Gunning, NP   25 mg at 10/24/14 2229    Allergies as of 10/24/2014  . (No Known Allergies)    Family History  Problem Relation Age of Onset  . Stroke Mother   . Cancer Sister     brain, breast    History   Social History  . Marital Status: Married    Spouse Name: N/A  . Number of Children: N/A  . Years of Education: N/A   Occupational History  . Not on file.   Social History Main Topics  . Smoking status: Never Smoker   . Smokeless tobacco: Never Used  . Alcohol Use: No  . Drug Use: No  . Sexual Activity: Not on file   Other Topics Concern  . Not on file   Social History Narrative    Review of Systems: Gen: Denies fever, chills, change in weight or weight loss CV: Denies chest pain, heart palpitations, syncope, edema  Resp: Denies shortness of breath with rest, cough, wheezing GI: See HPI. Derm: Denies rash, itching, dry skin Psych: Denies depression, anxiety,confusion, or memory loss Heme: Denies bruising, bleeding, and enlarged lymph nodes.  Physical Exam: Vital signs in last 24 hours: Temp:  [98 F (36.7 C)-98.6 F (37 C)] 98.2 F (36.8 C) (07/08 0629) Pulse Rate:  [90-100] 97 (07/08 0222) Resp:  [22-24] 22 (07/08 0629) BP: (120-135)/(57-74) 126/61 mmHg (07/08 0222) SpO2:  [96 %-100 %] 97 % (07/08  0629) Weight:  [132 lb (59.875 kg)-133 lb 3.2 oz (60.419 kg)] 133 lb 3.2 oz (60.419 kg) (07/08 0629) Last BM Date: 10/25/14 General:   Alert,  Well-developed, well-nourished, pleasant and cooperative in NAD Head:  Normocephalic and atraumatic. Eyes:  Sclera clear, no icterus. Conjunctiva pink. Ears:  Normal auditory acuity. Lungs:  Clear throughout to auscultation.   No wheezes, crackles, or rhonchi. No acute distress. Heart:  Regular rate and rhythm; no murmurs, clicks, rubs,  or gallops. Abdomen:  Soft, nontender and nondistended. No masses, hepatosplenomegaly or hernias noted. Normal bowel sounds, without guarding, and without rebound.   Rectal:  Deferred.   Msk:  Symmetrical without gross deformities. Normal posture. Generalized weakness. Pulses:  Normal DP pulses noted. Extremities:  Without clubbing or edema. Neurologic:  Alert and  oriented x4;  grossly normal neurologically. Skin:  Intact without significant lesions or rashes noted. Psych:  Alert and cooperative. Normal mood and affect.  Intake/Output from previous day: 07/07 0701 - 07/08 0700 In: 1245 [P.O.:480; I.V.:765] Out: -  Intake/Output this shift: Total I/O In: 240 [P.O.:240] Out: -   Lab Results:  Recent Labs  10/24/14 1013 10/25/14 0530  WBC 6.8 6.7  HGB 8.7* 8.1*  HCT 27.6* 25.3*  PLT 401* 428*   BMET  Recent Labs  10/24/14 1013 10/25/14 0530  NA 133* 133*  K 3.8 3.7  CL 99* 101  CO2 26 24  GLUCOSE 186* 164*  BUN 20 13  CREATININE 1.00 0.87  CALCIUM 9.1 8.4*   LFT  Recent Labs  10/24/14 1013  PROT 7.1  ALBUMIN 2.3*  AST 19  ALT 12*  ALKPHOS 69  BILITOT 0.6   PT/INR No results for input(s): LABPROT, INR in the last 72 hours. Hepatitis Panel No results for input(s): HEPBSAG, HCVAB, HEPAIGM, HEPBIGM in the last 72 hours. C-Diff No results  for input(s): CDIFFTOX in the last 72 hours.  Studies/Results: Ct Chest Wo Contrast  10/24/2014   ADDENDUM REPORT: 10/24/2014 15:32   ADDENDUM: The interval eccentric wall thickening in the small sliding hiatal hernia could be due to a neoplasm.   Electronically Signed   By: Claudie Revering M.D.   On: 10/24/2014 15:32   10/24/2014   CLINICAL DATA:  Followup lung nodules which had increased in number and size on a chest radiograph earlier today.  EXAM: CT CHEST WITHOUT CONTRAST  TECHNIQUE: Multidetector CT imaging of the chest was performed following the standard protocol without IV contrast.  COMPARISON:  CT dated 06/06/2014 and chest radiograph obtained earlier today.  FINDINGS: Interval increase in size of multiple mediastinal and hilar lymph nodes. These include an AP window lymph node with a short axis diameter of 18 mm on image number 19, right anterior para-aortic lymph node with a short axis diameter of 20 mm on image number 16, precarinal node with a short axis diameter of 15 mm on image number 23, subcarinal node with a short axis diameter of 21 mm on image number 28, right hilar node with a short axis diameter of 13 mm on image number 28 and left hilar node with a short axis diameter of 16 mm on image number 23.  There has been a significant increase in size and number of multiple lung nodules. Previously, the largest nodule was in the right lower lobe, measuring 7 mm in maximum diameter. Currently, that nodule measures 15 mm in maximum diameter.  The previously demonstrated liver cysts have not changed significantly. No interval solid liver masses are visualized. The previously demonstrated right renal mass is not included on today's images. There is interval eccentric wall thickening in a small sliding hiatal hernia, measuring 10 mm in maximum thickness on image number 45. No upper abdominal adenopathy currently visualized. The included portions of the adrenal glands have normal appearances.  Dense atheromatous coronary artery calcifications are noted. Also noted are thoracic spine degenerative changes. No evidence of bony metastatic  disease.  IMPRESSION: 1. Significant progression of pulmonary metastatic disease, most likely arising from a primary right renal cell carcinoma. 2. Interval extensive metastatic mediastinal and bilateral hilar adenopathy. 3. Dense atheromatous coronary artery calcifications.  Electronically Signed: By: Claudie Revering M.D. On: 10/24/2014 14:56   Dg Abd Acute W/chest  10/24/2014   CLINICAL DATA:  81 year old hypertensive diabetic female with vomiting and weakness. History of renal cell carcinoma. Subsequent encounter.  EXAM: DG ABDOMEN ACUTE W/ 1V CHEST  COMPARISON:  08/29/2014 chest x-ray. 06/06/2014 CT chest trauma chest, abdomen and pelvis.  FINDINGS: Cardiomegaly.  Calcified tortuous aorta.  Pulmonary vascular prominence most notable centrally.  Increased number and size of pulmonary nodules suggestive of metastatic disease.  Moderate stool throughout colon without plain film evidence of bowel obstruction or free intraperitoneal.  IMPRESSION: Moderate stool throughout colon without plain film evidence of bowel obstruction or free intraperitoneal.  Pulmonary nodule suggestive of pulmonary metastatic disease.  Cardiomegaly.  Central pulmonary vascular prominence.   Electronically Signed   By: Genia Del M.D.   On: 10/24/2014 11:16    Impression: 81 year old female with a history of GERD, dysphagia and esophageal dilation x 2 since 2010. Unknown date of last colonoscopy. She recently had a nephrectomy for renal CA and there are likely metastatic spots on her CXR. She presented with worsening generalized weakness and was found to have a significant drop in her hgb from baseline 11-13  to 8.7 on admission. She has had more decline overnight to 8.1. She denies frank GI bleed but admits dark stools with no iron supplementation or pepto use. She states this has been occurring for some time. Her stools were heme+ in the ER. She continues with profound weakness, likely at least in part contributed to by her acute  anemia. She also has had recurrence of solid food dysphagia, worse with dry foods and meats, which is affecting her appetite at least somewhat. She ate a minimal amount of breakfast approximately 07:30 this morning.  Plan: 1. NPO starting now 2. Plan for EGD today with Dr. Gala Romney, likely early pm 3. Continue supportive measures 4. Continue PPI    Walden Field, AGNP-C Adult & Gerontological Nurse Practitioner [facility]    LOS: 1 day     10/25/2014, 10:55 AM  Attending note:  Patient seen and examined. Agree with above assessment and recommendations. Plan for EGD with possible esophageal dilation later today.The risks, benefits, limitations, alternatives and imponderables have been reviewed with the patient. Potential for esophageal dilation, biopsy, etc. have also been reviewed.  Questions have been answered. All parties agreeable.

## 2014-10-25 NOTE — Evaluation (Signed)
Physical Therapy Evaluation Patient Details Name: Kathleen Schroeder MRN: [id number] DOB: [birth year] Today's Date: 10/25/2014   History of Present Illness  Kathleen Schroeder is a very pleasant 81 y.o. female with a past medical history includes renal cancer status post nephrectomy 4 weeks ago, diabetes, hypertension, Barrett's esophagus, dysphasia last E GD 2013 presents to emergency department with chief complaint of gradual worsening of generalized weakness. Initial evaluation in the emergency department reveals anemia hemoglobin 8.7 hyponatremia heme positive stools.  Clinical Impression  Pt needs motivation to ambulate would prefer to stay in bed. Therapist explained to pt the importance of taking frequent short walks to increase activity tolerance.  PT will need skilled PT to improve current functional tolerance.     Follow Up Recommendations Home health PT    Equipment Recommendations  None recommended by PT    Recommendations for Other Services       Precautions / Restrictions Precautions Precautions: Fall Restrictions Weight Bearing Restrictions: No      Mobility  Bed Mobility Overal bed mobility: Modified Independent                Transfers Overall transfer level: Modified independent Equipment used: Rolling walker (2 wheeled)                Ambulation/Gait Ambulation/Gait assistance: Supervision Ambulation Distance (Feet): 56 Feet Assistive device: Rolling walker (2 wheeled) Gait Pattern/deviations: Decreased step length - right;Decreased step length - left;Trunk flexed   Gait velocity interpretation: Below normal speed for age/gender              Pertinent Vitals/Pain Pain Assessment: No/denies pain    Home Living Family/patient expects to be discharged to:: Private residence Living Arrangements: Spouse/significant other Available Help at Discharge: Family Type of Home: House Home Access: Stairs to enter   Technical brewer of Steps:  4 Home Layout: One level Home Equipment: Bedside commode;Shower seat;Walker - 4 wheels      Prior Function Level of Independence: Independent with assistive device(s)                  Extremity/Trunk Assessment               Lower Extremity Assessment: Generalized weakness         Communication   Communication: No difficulties  Cognition Arousal/Alertness: Awake/alert   Overall Cognitive Status: Within Functional Limits for tasks assessed                      General Comments            Assessment/Plan    PT Assessment Patient needs continued PT services  PT Diagnosis Difficulty walking;Generalized weakness   PT Problem List Decreased strength;Decreased activity tolerance  PT Treatment Interventions Gait training;Functional mobility training;Therapeutic activities;Therapeutic exercise;Patient/family education   PT Goals (Current goals can be found in the Care Plan section) Acute Rehab PT Goals PT Goal Formulation: With patient Potential to Achieve Goals: Good    Frequency Min 3X/week   Barriers to discharge        Co-evaluation               End of Session Equipment Utilized During Treatment: Gait belt Activity Tolerance: Patient tolerated treatment well             Time: 4098-1191 PT Time Calculation (min) (ACUTE ONLY): 37 min   Charges:   PT Evaluation $Initial PT Evaluation Tier I: 1 Procedure     PT  G CodesRayetta Humphrey, PT CLT [phone] 10/25/2014, 9:16 AM

## 2014-10-25 NOTE — Progress Notes (Signed)
[facility] PROGRESS NOTE  Kathleen Schroeder KGY:[id number] DOB: 1930-12-09 DOA: 10/24/2014 PCP: Eulas Post, MD  Assessment/Plan: Weakness: Likely multifactorial i.e. decreased by mouth intake related to dysphagia and nausea in setting of dehydration and anemia. MedSurg. Some improvement this am. Hg trending down slightly. Continues to not eat. await GI input. PT recommend HH PT Active Problems: Melana: She denies NSAID use. Unsure of last colonoscopy. Heme positive stools in the emergency department. See #1.  Anemia: Baseline 11 and hemoglobin less than that and patient is dehydrated. appears to be 9-10. Continue protonix. Appreciate GI assistance  Dysphagia: Patient complains of sensation of food getting stuck in her chest. History of esophageal stricture. Chart review indicates last EGD done 2013.  Reports this is the main reason she is not eating and drinking.  Await GI input. May need EGD   Pulmonary nodules: progressive since 05/2014.  Concern for metastatic disease. Outpatient follow-up   Hyponatremia: Mild. Stable. Monitor.    Type 2 diabetes mellitus, controlled: recent A1c 7.0. CBG range 150 Continue to hold oral agents and use sliding scale insulin.   Hyperlipidemia: Recent lipid panel unremarkable continue home meds   Essential hypertension: Fair control. Holding home BB and HCTZ  Renal Cancer status post right nephrectomy 4 weeks ago. Creatinine 1.00 BUN 20. Will monitor intake and output. Incision well-healed     Code Status: full Family Communication: husband at bedside Disposition Plan: home when ready   Consultants:  GI  Procedures:  none  Antibiotics:  none  HPI/Subjective: Sitting in chair. Reports "feeling better" reports unable to eat breakfast as food "bad" denies pain/discomfort/nausea  Objective: Filed Vitals:   10/25/14 0629  BP:   Pulse:   Temp: 98.2 F (36.8 C)  Resp: 22    Intake/Output Summary (Last 24 hours) at  10/25/14 1015 Last data filed at 10/25/14 0825  Gross per 24 hour  Intake   1485 ml  Output      0 ml  Net   1485 ml   Filed Weights   10/24/14 1009 10/24/14 1511 10/25/14 0629  Weight: 59.875 kg (132 lb) 59.875 kg (132 lb) 60.419 kg (133 lb 3.2 oz)    Exam:   General:  Slightly pale but appear comfortable  Cardiovascular: RRR no MGR no LE edema  Respiratory: normal effort BS clear no wheeze  Abdomen: non-distended non-tender +BS  Musculoskeletal: joints without swelling/erythema   Data Reviewed: Basic Metabolic Panel:  Recent Labs Lab 10/22/14 1008 10/24/14 1013 10/25/14 0530  NA 131* 133* 133*  K 4.1 3.8 3.7  CL 97 99* 101  CO2 25 26 24   GLUCOSE 236* 186* 164*  BUN 17 20 13   CREATININE 1.03 1.00 0.87  CALCIUM 9.7 9.1 8.4*   Liver Function Tests:  Recent Labs Lab 10/22/14 1008 10/24/14 1013  AST 20 19  ALT 13 12*  ALKPHOS 72 69  BILITOT 0.5 0.6  PROT 7.7 7.1  ALBUMIN 2.8* 2.3*   No results for input(s): LIPASE, AMYLASE in the last 168 hours. No results for input(s): AMMONIA in the last 168 hours. CBC:  Recent Labs Lab 10/22/14 1008 10/24/14 1013 10/25/14 0530  WBC 8.2 6.8 6.7  NEUTROABS 6.5 4.8  --   HGB 9.4* 8.7* 8.1*  HCT 29.6* 27.6* 25.3*  MCV 78.7 80.2 79.3  PLT 514.0* 401* 428*   Cardiac Enzymes:  Recent Labs Lab 10/24/14 1013 10/24/14 1805 10/25/14 0522  TROPONINI <0.03 <0.03 <0.03   BNP (last 3 results) No results for  input(s): BNP in the last 8760 hours.  ProBNP (last 3 results) No results for input(s): PROBNP in the last 8760 hours.  CBG:  Recent Labs Lab 10/24/14 1613 10/24/14 2109 10/25/14 0747  GLUCAP 130* 159* 150*    No results found for this or any previous visit (from the past 240 hour(s)).   Studies: Ct Chest Wo Contrast  10/24/2014   ADDENDUM REPORT: 10/24/2014 15:32  ADDENDUM: The interval eccentric wall thickening in the small sliding hiatal hernia could be due to a neoplasm.   Electronically  Signed   By: Claudie Revering M.D.   On: 10/24/2014 15:32   10/24/2014   CLINICAL DATA:  Followup lung nodules which had increased in number and size on a chest radiograph earlier today.  EXAM: CT CHEST WITHOUT CONTRAST  TECHNIQUE: Multidetector CT imaging of the chest was performed following the standard protocol without IV contrast.  COMPARISON:  CT dated 06/06/2014 and chest radiograph obtained earlier today.  FINDINGS: Interval increase in size of multiple mediastinal and hilar lymph nodes. These include an AP window lymph node with a short axis diameter of 18 mm on image number 19, right anterior para-aortic lymph node with a short axis diameter of 20 mm on image number 16, precarinal node with a short axis diameter of 15 mm on image number 23, subcarinal node with a short axis diameter of 21 mm on image number 28, right hilar node with a short axis diameter of 13 mm on image number 28 and left hilar node with a short axis diameter of 16 mm on image number 23.  There has been a significant increase in size and number of multiple lung nodules. Previously, the largest nodule was in the right lower lobe, measuring 7 mm in maximum diameter. Currently, that nodule measures 15 mm in maximum diameter.  The previously demonstrated liver cysts have not changed significantly. No interval solid liver masses are visualized. The previously demonstrated right renal mass is not included on today's images. There is interval eccentric wall thickening in a small sliding hiatal hernia, measuring 10 mm in maximum thickness on image number 45. No upper abdominal adenopathy currently visualized. The included portions of the adrenal glands have normal appearances.  Dense atheromatous coronary artery calcifications are noted. Also noted are thoracic spine degenerative changes. No evidence of bony metastatic disease.  IMPRESSION: 1. Significant progression of pulmonary metastatic disease, most likely arising from a primary right renal cell  carcinoma. 2. Interval extensive metastatic mediastinal and bilateral hilar adenopathy. 3. Dense atheromatous coronary artery calcifications.  Electronically Signed: By: Claudie Revering M.D. On: 10/24/2014 14:56   Dg Abd Acute W/chest  10/24/2014   CLINICAL DATA:  81 year old hypertensive diabetic female with vomiting and weakness. History of renal cell carcinoma. Subsequent encounter.  EXAM: DG ABDOMEN ACUTE W/ 1V CHEST  COMPARISON:  08/29/2014 chest x-ray. 06/06/2014 CT chest trauma chest, abdomen and pelvis.  FINDINGS: Cardiomegaly.  Calcified tortuous aorta.  Pulmonary vascular prominence most notable centrally.  Increased number and size of pulmonary nodules suggestive of metastatic disease.  Moderate stool throughout colon without plain film evidence of bowel obstruction or free intraperitoneal.  IMPRESSION: Moderate stool throughout colon without plain film evidence of bowel obstruction or free intraperitoneal.  Pulmonary nodule suggestive of pulmonary metastatic disease.  Cardiomegaly.  Central pulmonary vascular prominence.   Electronically Signed   By: Genia Del M.D.   On: 10/24/2014 11:16    Scheduled Meds: . amLODipine  5 mg Oral Daily  .  brimonidine  1 drop Both Eyes BID  . docusate sodium  100 mg Oral BID  . dorzolamide-timolol  1 drop Both Eyes BID  . feeding supplement (ENSURE ENLIVE)  237 mL Oral BID BM  . insulin aspart  0-15 Units Subcutaneous TID WC  . insulin aspart  0-5 Units Subcutaneous QHS  . mirtazapine  15 mg Oral QHS  . pantoprazole (PROTONIX) IV  40 mg Intravenous Q24H  . simvastatin  20 mg Oral Daily   Continuous Infusions: . sodium chloride 75 mL/hr at 10/25/14 0330    Principal Problem:   Weakness Active Problems:   Type 2 diabetes mellitus, controlled   Hyperlipidemia   Essential hypertension   Dysphagia   Pulmonary nodules   Hyponatremia   Melena   Anemia   Generalized weakness    Time spent: 30 minutes    [place]  [facility] Pager [phone]. If 7PM-7AM, please contact night-coverage at [email or web address], password [facility] 10/25/2014, 10:15 AM  LOS: 1 day

## 2014-10-25 NOTE — Progress Notes (Signed)
Unable to complete physical exam at this   time. Pt is gone for EGD. Obtained current hx from husband who is primary caregiver.

## 2014-10-25 NOTE — Op Note (Signed)
[facility] [address] [place], [address]   ENDOSCOPY PROCEDURE REPORT  PATIENT: Kathleen Schroeder, Kathleen Schroeder  MR#: #[id number] BIRTHDATE: 03/01/1931 , 21  yrs. old GENDER: female ENDOSCOPIST: R.  [place] Cornea, MD FACP FACG REFERRED BY:  Carolann Littler, M.D.  Hildred Laser, M.D. PROCEDURE DATE:  2014-11-18 PROCEDURE:  EGD w/ biopsy and Maloney dilation of esophagus INDICATIONS:  Worsening esophageal dysphagia.  Reported melena. Acute on chronic anemia.  Metastatic renal cell carcinoma. MEDICATIONS: Versed 3 mg IV and Demerol 25 mg IV in divided doses. Zofran 4 mg IV.  Xylocaine gel orally ASA CLASS:      Class III  CONSENT: The risks, benefits, limitations, alternatives and imponderables have been discussed.  The potential for biopsy, esophogeal dilation, etc. have also been reviewed.  Questions have been answered.  All parties agreeable.  Please see the history and physical in the medical record for more information.  DESCRIPTION OF PROCEDURE: After the risks benefits and alternatives of the procedure were thoroughly explained, informed consent was obtained.  The    endoscope was introduced through the mouth and advanced to the second portion of the duodenum , limited by Without limitations. The instrument was slowly withdrawn as the mucosa was fully examined. Estimated blood loss is zero unless otherwise noted in this procedure report.    Prominent Schatzki's ring present; otherwise, the remainder the tubular esophagus appeared normal.  Stomach empty.  5 mm polypoid nodule in a 3 cm hiatal hernia sac.  Patient had similar appearing 3 mm polypoid lesions in the proximal stomach.  No ulcer or obvious infiltrating process.  Patent pylorus.  Examination of the second and third portion of the duodenum demonstrated a 5 mm nodule in the proximal portion of the third portion of the duodenum.  Scope was withdrawn, a 52 [place] Maloney dilator was passed to full insertion  with mild to moderate resistance.  A look back revealed the ring had been partially ruptured with this maneuver with some minimal bleeding.  Subsequently , I biopsied the duodenal nodule and one the polypoid nodules in the proximal stomach (contained in the hernia sac) .  No obvious neoplasm in the hernia sac as suggested on the recent CT scan.  I elected to go ahead and also take three-quarter "bites" of the ring to additionally disrupted.  This was done without difficulty and with minimal bleeding.  Retroflexed views revealed as previously described.     The scope was then withdrawn from the patient and the procedure completed. EBL 10 mL COMPLICATIONS: There were no immediate complications.  ENDOSCOPIC IMPRESSION: Prominent Schatzki's ring?" dilated and disrupted as described above. Hiatal hernia. Gastric and duodenal nodules of doubtful clinical significance?"status post biopsy  RECOMMENDATIONS: Continue proton pump inhibitor therapy once daily. Advance to a soft diet. Follow-up on pathology. Recommend oncology input. Patient may benefit from undergoing a colonoscopy but this can be considered as an outpatient when she follows up with Dr. Laural Golden  REPEAT EXAM:  eSigned:  R. [place] Cornea, MD Rosalita Chessman [facility] 11-18-2014 3:01 PM    CC:  CPT CODES: ICD CODES:  The ICD and CPT codes recommended by this software are interpretations from the data that the clinical staff has captured with the software.  The verification of the translation of this report to the ICD and CPT codes and modifiers is the sole responsibility of the health care institution and practicing physician where this report was generated.  [place]. will not be held responsible for  the validity of the ICD and CPT codes included on this report.  AMA assumes no liability for data contained or not contained herein. CPT is a Designer, television/film set of the [employer].  PATIENT NAME:   Kathleen Schroeder, Kathleen Schroeder MR#: #[id number]

## 2014-10-25 NOTE — Care Management (Signed)
Important Message  Patient Details  Name: Kathleen Schroeder MRN: [id number] Date of Birth: 04-25-1930   Medicare Important Message Given:  Yes-second notification given    Sherald Barge, RN 10/25/2014, 12:26 PM

## 2014-10-25 NOTE — Care Management Note (Signed)
Case Management Note  Patient Details  Name: Kathleen Schroeder MRN: [id number] Date of Birth: 04-20-1930  Expected Discharge Date:  10/29/14               Expected Discharge Plan:  [place]  In-House Referral:  NA  Discharge planning Services  CM Consult  Post Acute Care Choice:  Resumption of Svcs/PTA Provider Choice offered to:     DME Arranged:    DME Agency:     HH Arranged:  RN, PT [place] Agency:  [place]  Status of Service:  Completed, signed off  Medicare Important Message Given:    Date Medicare IM Given:    Medicare IM give by:    Date Additional Medicare IM Given:    Additional Medicare Important Message give by:     If discussed at [place] of Stay Meetings, dates discussed:    Additional Comments: Pt is from home, lives with her husband and is independent at baseline. Pt has a cane and walker to use as needed. Pt has [place] RN through [facility] prior to admission. Pt plans to discharge home with [facility] RN and PT. Pt would like to continue to use [facility]. Romualdo Bolk, of [facility] made aware of admission and will obtain pt info from chart. If pt discharges over weekend, RN will notify [facility] of discharge and fax [place] orders. No further CM needs.  Sherald Barge, RN 10/25/2014, 12:12 PM

## 2014-10-25 NOTE — Progress Notes (Signed)
Initial Nutrition Assessment  DOCUMENTATION CODES:  Severe malnutrition in context of chronic illness  INTERVENTION:  Ensure Enlive (each supplement provides 350kcal and 20 grams of protein), Boost Breeze  Recommend [place] follow for support  NUTRITION DIAGNOSIS:  Inadequate oral intake related to inability to eat, dysphagia as evidenced by meal completion < 50%, per patient/family report, percent weight loss.   GOAL:  Patient will meet greater than or equal to 90% of their needs, Weight gain   MONITOR:  PO intake, Diet advancement, Supplement acceptance, Weight trends, Labs  REASON FOR ASSESSMENT:  Malnutrition Screening Tool    ASSESSMENT: Pt is out of room for EGD and husband is present. He is primary caregiver. Pt was assessed by RD 08/30/14- wt loss and poor po intake reported by family. Moderate malnutrition identified at that time.  Her nutritional status has continued to decline. Hx of Dysphagia noted. Husband reports only "nibbles of food and sips of liquids". He thinks "she's given up". He is tearful as he says that have been married for more than 60 years now. He has been daily encouraging past favorite foods and beverages but pt has not been responding as he hoped. He says her appetite started to decline back the end of last year and has progressively gotten worse. Her weight has decreased 14% in < 60 days which is severe. Will follow for GI findings and recommendations.  Height:  Ht Readings from Last 1 Encounters:  10/24/14 5\' 5"  (1.651 m)    Weight:  Wt Readings from Last 1 Encounters:  10/25/14 133 lb 3.2 oz (60.419 kg)    Ideal Body Weight:    56.8 kg  Wt Readings from Last 10 Encounters:  10/25/14 133 lb 3.2 oz (60.419 kg)  10/22/14 132 lb (59.875 kg)  09/03/14 141 lb (63.957 kg)  08/29/14 155 lb (70.308 kg)  08/13/14 156 lb 4.9 oz (70.9 kg)  08/06/14 150 lb (68.04 kg)  07/25/14 152 lb (68.947 kg)  05/30/14 155 lb (70.308 kg)  05/23/14 155  lb (70.308 kg)  05/19/14 167 lb (75.751 kg)    BMI:  Body mass index is 22.17 kg/(m^2).  Estimated Nutritional Needs:  Kcal:  1600-1800  Protein:  78-85 gr  Fluid:  1.6-1.8 liters daily  Skin:    WDL  Diet Order:  DIET SOFT Room service appropriate?: Yes; Fluid consistency:: Thin  EDUCATION NEEDS:  Education needs no appropriate at this time   Intake/Output Summary (Last 24 hours) at 10/25/14 1555 Last data filed at 10/25/14 0825  Gross per 24 hour  Intake   1485 ml  Output      0 ml  Net   1485 ml    Last BM:  7/8  Colman Cater MS,RD,CSG,LDN Office: [phone] Pager: [phone]

## 2014-10-26 DIAGNOSIS — I10 Essential (primary) hypertension: Secondary | ICD-10-CM

## 2014-10-26 DIAGNOSIS — E43 Unspecified severe protein-calorie malnutrition: Secondary | ICD-10-CM | POA: Diagnosis present

## 2014-10-26 DIAGNOSIS — R1314 Dysphagia, pharyngoesophageal phase: Secondary | ICD-10-CM

## 2014-10-26 LAB — HEMOGLOBIN AND HEMATOCRIT, BLOOD
HCT: 24.7 % — ABNORMAL LOW (ref 36.0–46.0)
Hemoglobin: 7.9 g/dL — ABNORMAL LOW (ref 12.0–15.0)

## 2014-10-26 LAB — GLUCOSE, CAPILLARY
GLUCOSE-CAPILLARY: 153 mg/dL — AB (ref 65–99)
GLUCOSE-CAPILLARY: 191 mg/dL — AB (ref 65–99)

## 2014-10-26 NOTE — Progress Notes (Signed)
Faxed face sheet and Home Health orders to [facility].  Called to inform of pt discharge.

## 2014-10-26 NOTE — Progress Notes (Signed)
Swallowing much better this morning since Schatzki's ring dilated. Tolerating a soft diet. No obvious GI bleeding thus far in-house.  Vital signs in last 24 hours: Temp:  [98.1 F (36.7 C)-99.2 F (37.3 C)] 99.2 F (37.3 C) (07/09 0518) Pulse Rate:  [108-115] 108 (07/09 0518) Resp:  [20] 20 (07/08 1310) BP: (0-144)/(0-76) 127/57 mmHg (07/09 0518) SpO2:  [93 %-100 %] 93 % (07/08 2154) Weight:  [134 lb 14.4 oz (61.19 kg)] 134 lb 14.4 oz (61.19 kg) (07/09 0518) Last BM Date: 10/25/14 General:   Chronically ill-appearing, elderly lady..  pleasant and cooperative in NAD Abdomen:  Nondistended. Soft and nontender Extremities:  Without clubbing or edema.    Intake/Output from previous day: 07/08 0701 - 07/09 0700 In: 1740 [P.O.:540; I.V.:1200] Out: 700 [Urine:700] Intake/Output this shift:    Lab Results:  Recent Labs  10/24/14 1013 10/25/14 0530  WBC 6.8 6.7  HGB 8.7* 8.1*  HCT 27.6* 25.3*  PLT 401* 428*   BMET  Recent Labs  10/24/14 1013 10/25/14 0530  NA 133* 133*  K 3.8 3.7  CL 99* 101  CO2 26 24  GLUCOSE 186* 164*  BUN 20 13  CREATININE 1.00 0.87  CALCIUM 9.1 8.4*   LFT  Recent Labs  10/24/14 1013  PROT 7.1  ALBUMIN 2.3*  AST 19  ALT 12*  ALKPHOS 69  BILITOT 0.6   PT/INR No results for input(s): LABPROT, INR in the last 72 hours. Hepatitis Panel No results for input(s): HEPBSAG, HCVAB, HEPAIGM, HEPBIGM in the last 72 hours. C-Diff No results for input(s): CDIFFTOX in the last 72 hours.  Studies/Results: Ct Chest Wo Contrast  10/24/2014   ADDENDUM REPORT: 10/24/2014 15:32  ADDENDUM: The interval eccentric wall thickening in the small sliding hiatal hernia could be due to a neoplasm.   Electronically Signed   By: Claudie Revering M.D.   On: 10/24/2014 15:32   10/24/2014   CLINICAL DATA:  Followup lung nodules which had increased in number and size on a chest radiograph earlier today.  EXAM: CT CHEST WITHOUT CONTRAST  TECHNIQUE: Multidetector CT imaging of  the chest was performed following the standard protocol without IV contrast.  COMPARISON:  CT dated 06/06/2014 and chest radiograph obtained earlier today.  FINDINGS: Interval increase in size of multiple mediastinal and hilar lymph nodes. These include an AP window lymph node with a short axis diameter of 18 mm on image number 19, right anterior para-aortic lymph node with a short axis diameter of 20 mm on image number 16, precarinal node with a short axis diameter of 15 mm on image number 23, subcarinal node with a short axis diameter of 21 mm on image number 28, right hilar node with a short axis diameter of 13 mm on image number 28 and left hilar node with a short axis diameter of 16 mm on image number 23.  There has been a significant increase in size and number of multiple lung nodules. Previously, the largest nodule was in the right lower lobe, measuring 7 mm in maximum diameter. Currently, that nodule measures 15 mm in maximum diameter.  The previously demonstrated liver cysts have not changed significantly. No interval solid liver masses are visualized. The previously demonstrated right renal mass is not included on today's images. There is interval eccentric wall thickening in a small sliding hiatal hernia, measuring 10 mm in maximum thickness on image number 45. No upper abdominal adenopathy currently visualized. The included portions of the adrenal glands have normal appearances.  Dense atheromatous coronary artery calcifications are noted. Also noted are thoracic spine degenerative changes. No evidence of bony metastatic disease.  IMPRESSION: 1. Significant progression of pulmonary metastatic disease, most likely arising from a primary right renal cell carcinoma. 2. Interval extensive metastatic mediastinal and bilateral hilar adenopathy. 3. Dense atheromatous coronary artery calcifications.  Electronically Signed: By: Claudie Revering M.D. On: 10/24/2014 14:56   Dg Abd Acute W/chest  10/24/2014    CLINICAL DATA:  81 year old hypertensive diabetic female with vomiting and weakness. History of renal cell carcinoma. Subsequent encounter.  EXAM: DG ABDOMEN ACUTE W/ 1V CHEST  COMPARISON:  08/29/2014 chest x-ray. 06/06/2014 CT chest trauma chest, abdomen and pelvis.  FINDINGS: Cardiomegaly.  Calcified tortuous aorta.  Pulmonary vascular prominence most notable centrally.  Increased number and size of pulmonary nodules suggestive of metastatic disease.  Moderate stool throughout colon without plain film evidence of bowel obstruction or free intraperitoneal.  IMPRESSION: Moderate stool throughout colon without plain film evidence of bowel obstruction or free intraperitoneal.  Pulmonary nodule suggestive of pulmonary metastatic disease.  Cardiomegaly.  Central pulmonary vascular prominence.   Electronically Signed   By: Genia Del M.D.   On: 10/24/2014 11:16    Assessment: Principal Problem:   Weakness Active Problems:   Type 2 diabetes mellitus, controlled   Hyperlipidemia   Essential hypertension   Dysphagia   Pulmonary nodules   Hyponatremia   Melena   Anemia   Generalized weakness   Schatzki's ring   Mucosal abnormality of duodenum   Mucosal abnormality of stomach  LOS: 2 days   Impression:    Patient with long-standing GERD and esophageal dysphagia. Swallowing much better since her Schatzki's ring was dilated. Gastric and duodenal nodules biopsied. No other significant pathology found an upper GI tract. Acute on chronic anemia. Hemoccult-positive.  No overt GI bleeding this admission.  Apparent, progressing metastatic disease based on recent chest CT.  Recommendations:  Continue Protonix 40 mg daily. Follow-up with oncology and Dr. Laural Golden.  May be dischargeable soon per hospitalist service. H&H today.  I'll follow-up on EGD biopsies.  Manus Rudd  10/26/2014, 9:52 AM

## 2014-10-26 NOTE — Discharge Summary (Signed)
Physician Discharge Summary  Kathleen Schroeder QJJ:[id number] DOB: June 30, 1930 DOA: 10/24/2014  PCP: Eulas Post, MD  Admit date: 10/24/2014 Discharge date: 10/26/2014  Time spent: 35 minutes  Recommendations for Outpatient Follow-up:  1. Follow up with Dr Dereck Leep of GI in 2 weeks.  2. Follow up with Dr Whitney Muse for cancer progression.  3. Follow up with Dr Elease Hashimoto as scheduled.   Discharge Diagnoses:  Principal Problem:   Weakness Active Problems:   Type 2 diabetes mellitus, controlled   Hyperlipidemia   Essential hypertension   Dysphagia   Pulmonary nodules   Hyponatremia   Melena   Anemia   Generalized weakness   Schatzki's ring   Mucosal abnormality of duodenum   Mucosal abnormality of stomach   Protein-calorie malnutrition, severe   Discharge Condition: Stable.   Diet recommendation: soft diet.   Filed Weights   10/24/14 1511 10/25/14 0629 10/26/14 0518  Weight: 59.875 kg (132 lb) 60.419 kg (133 lb 3.2 oz) 61.19 kg (134 lb 14.4 oz)    History of present illness: Patient was admitted by Dyanne Carrel NP C and I on July 7th 2016 for weakness and dysphagia.  As per her H and P:  " Kathleen Schroeder is a very pleasant 81 y.o. female with a past medical history includes renal cancer status post nephrectomy 4 weeks ago, diabetes, hypertension, Barrett's esophagus, dysphasia last E GD 2013 presents to emergency department with chief complaint of gradual worsening of generalized weakness. Initial evaluation in the emergency department reveals anemia hemoglobin 8.7 hyponatremia heme positive stools.  Patient reports she has not been herself since she had her nephrectomy 4 weeks ago. Her energy level waxes and wanes and her ability to take in fluid and nourishment waxes and wanes as well. Past week she reports sensation of food getting "stuck in my chest" and when she chews she develops nausea. She denies difficulty chewing or swallowing pain with chewing or swallowing or coughing with  eating or drinking. She denies abdominal pain endorses 2 episodes of emesis several days ago. She denies coffee ground emesis. She does endorse melanoma. She does not take Pepto-Bismol or iron supplements. She denies a chest pain dizziness visual disturbances syncope or near-syncope. She denies shortness of breath. Does report last night she was so weak she was unable to bear weight. She denies dysuria hematuria frequency or urgency. In the emergency department workup significant for hemoglobin 8.7 platelets 401 sodium 133 chloride 99 serum glucose 186. Rest x-ray reveals moderate stool throughout colon, cardiomegaly, central pulmonary vascular prominence, pulmonary nodule suggestive of pulmonary metastatic disease. She is hemodynamically stable and not hypoxic.  And my note: " Patient was seen, chart reviewed and discussed with family and Dyanne Carrel C NP. She is admitted for FTT after surgery for her kidney cancer, also has hx of GERD and prior stricture requiring dilatation. Now complained of dysphagia,poor appetite, and CXR showed possible metastatic tumors. Will need plain CT of the chest, and agreed with GI consultation for possible EGD and dilatation. She is stable.    Hospital Course: patient was admitted into the hospital, and she was seen in consultation with Dr Jerilynn Mages. Rouke.  It was felt that she has peptic stricture, and she was taken to the OR for EGD with biopsy and dilatation.  She did well with this, and was able to eat.  She has positive stool guaic, but not actively bleeding.  Her follow up Hb was low, but stable at around 8 grams per dL.  She was recommended to follow up with Dr Dereck Leep for her biopy results.  With respect to her RCC, s/p nephrectomy, a CT of the chest was done, and it showed progression of her metastatic disease. Due to her pre renal azotemia, and with only one kidney present, I have discontinued her Metformin as well.    Dr Donald Pore office was notified by Dyanne Carrel, NP  C, and will follow up with her next week.  She is anxious to go home, and is stable for discharge.  Thank you and good day.   Procedures:  EGD with dilatation and Bx.  Consultations:  GI:  Dr Felipe Drone.   Discharge Exam: Filed Vitals:   10/26/14 0518  BP: 127/57  Pulse: 108  Temp: 99.2 F (37.3 C)  Resp:      Discharge Instructions   Discharge Instructions    Diet - low sodium heart healthy    Complete by:  As directed      Discharge instructions    Complete by:  As directed   Follow up with Dr Dereck Leep for biopsy results.  Follow up with Dr Whitney Muse for your progression of cancer.     Increase activity slowly    Complete by:  As directed           Current Discharge Medication List    CONTINUE these medications which have NOT CHANGED   Details  albuterol (PROVENTIL HFA;VENTOLIN HFA) 108 (90 BASE) MCG/ACT inhaler Inhale 1-2 puffs into the lungs every 6 (six) hours as needed for wheezing or shortness of breath. Qty: 1 Inhaler, Refills: 0    amLODipine (NORVASC) 5 MG tablet Take 1 tablet by mouth daily. Refills: 10    bisoprolol-hydrochlorothiazide (ZIAC) 5-6.25 MG per tablet Take 1 tablet by mouth daily.    brimonidine (ALPHAGAN) 0.15 % ophthalmic solution Place 1 drop into both eyes 2 (two) times daily.     dorzolamide-timolol (COSOPT) 22.3-6.8 MG/ML ophthalmic solution Place 1 drop into both eyes 2 (two) times daily.     esomeprazole (NEXIUM) 40 MG capsule Take 1 capsule by mouth daily.  Refills: 1    losartan (COZAAR) 50 MG tablet Take 1 tablet (50 mg total) by mouth daily. Qty: 30 tablet, Refills: 11    mirtazapine (REMERON) 15 MG tablet Take 1 tablet by mouth at bedtime.  Refills: 0    ACCU-CHEK AVIVA PLUS test strip Check 1 time daily. E11.9 Qty: 100 each, Refills: 2    ACCU-CHEK SOFTCLIX LANCETS lancets USE AS DIRECTED Qty: 100 each, Refills: 2    megestrol (MEGACE) 40 MG tablet Take 1 tablet (40 mg total) by mouth 2 (two) times daily. Qty: 60 tablet,  Refills: 0    simvastatin (ZOCOR) 20 MG tablet Take 1 tablet (20 mg total) by mouth daily. Qty: 30 tablet, Refills: 11      STOP taking these medications     metFORMIN (GLUCOPHAGE) 500 MG tablet        No Known Allergies Follow-up Information    Follow up with [facility].   Contact information:   [address] [place] [address] [phone]       Follow up with Molli Hazard, MD On 10/30/2014.   Specialties:  Hematology and Oncology, Oncology   Why:  Arrive at 3 PM   Contact information:   [place] [id number] [phone]        The results of significant diagnostics from this hospitalization (including imaging, microbiology, ancillary  and laboratory) are listed below for reference.    Significant Diagnostic Studies: Ct Chest Wo Contrast  10/24/2014   ADDENDUM REPORT: 10/24/2014 15:32  ADDENDUM: The interval eccentric wall thickening in the small sliding hiatal hernia could be due to a neoplasm.   Electronically Signed   By: Claudie Revering M.D.   On: 10/24/2014 15:32   10/24/2014   CLINICAL DATA:  Followup lung nodules which had increased in number and size on a chest radiograph earlier today.  EXAM: CT CHEST WITHOUT CONTRAST  TECHNIQUE: Multidetector CT imaging of the chest was performed following the standard protocol without IV contrast.  COMPARISON:  CT dated 06/06/2014 and chest radiograph obtained earlier today.  FINDINGS: Interval increase in size of multiple mediastinal and hilar lymph nodes. These include an AP window lymph node with a short axis diameter of 18 mm on image number 19, right anterior para-aortic lymph node with a short axis diameter of 20 mm on image number 16, precarinal node with a short axis diameter of 15 mm on image number 23, subcarinal node with a short axis diameter of 21 mm on image number 28, right hilar node with a short axis diameter of 13 mm on image number 28 and left hilar node with a short  axis diameter of 16 mm on image number 23.  There has been a significant increase in size and number of multiple lung nodules. Previously, the largest nodule was in the right lower lobe, measuring 7 mm in maximum diameter. Currently, that nodule measures 15 mm in maximum diameter.  The previously demonstrated liver cysts have not changed significantly. No interval solid liver masses are visualized. The previously demonstrated right renal mass is not included on today's images. There is interval eccentric wall thickening in a small sliding hiatal hernia, measuring 10 mm in maximum thickness on image number 45. No upper abdominal adenopathy currently visualized. The included portions of the adrenal glands have normal appearances.  Dense atheromatous coronary artery calcifications are noted. Also noted are thoracic spine degenerative changes. No evidence of bony metastatic disease.  IMPRESSION: 1. Significant progression of pulmonary metastatic disease, most likely arising from a primary right renal cell carcinoma. 2. Interval extensive metastatic mediastinal and bilateral hilar adenopathy. 3. Dense atheromatous coronary artery calcifications.  Electronically Signed: By: Claudie Revering M.D. On: 10/24/2014 14:56   Dg Abd Acute W/chest  10/24/2014   CLINICAL DATA:  81 year old hypertensive diabetic female with vomiting and weakness. History of renal cell carcinoma. Subsequent encounter.  EXAM: DG ABDOMEN ACUTE W/ 1V CHEST  COMPARISON:  08/29/2014 chest x-ray. 06/06/2014 CT chest trauma chest, abdomen and pelvis.  FINDINGS: Cardiomegaly.  Calcified tortuous aorta.  Pulmonary vascular prominence most notable centrally.  Increased number and size of pulmonary nodules suggestive of metastatic disease.  Moderate stool throughout colon without plain film evidence of bowel obstruction or free intraperitoneal.  IMPRESSION: Moderate stool throughout colon without plain film evidence of bowel obstruction or free intraperitoneal.   Pulmonary nodule suggestive of pulmonary metastatic disease.  Cardiomegaly.  Central pulmonary vascular prominence.   Electronically Signed   By: Genia Del M.D.   On: 10/24/2014 11:16    Microbiology: No results found for this or any previous visit (from the past 240 hour(s)).   Labs: Basic Metabolic Panel:  Recent Labs Lab 10/22/14 1008 10/24/14 1013 10/25/14 0530  NA 131* 133* 133*  K 4.1 3.8 3.7  CL 97 99* 101  CO2 25 26 24   GLUCOSE 236* 186* 164*  BUN 17 20 13   CREATININE 1.03 1.00 0.87  CALCIUM 9.7 9.1 8.4*   Liver Function Tests:  Recent Labs Lab 10/22/14 1008 10/24/14 1013  AST 20 19  ALT 13 12*  ALKPHOS 72 69  BILITOT 0.5 0.6  PROT 7.7 7.1  ALBUMIN 2.8* 2.3*   No results for input(s): LIPASE, AMYLASE in the last 168 hours. No results for input(s): AMMONIA in the last 168 hours. CBC:  Recent Labs Lab 10/22/14 1008 10/24/14 1013 10/25/14 0530 10/26/14 1113  WBC 8.2 6.8 6.7  --   NEUTROABS 6.5 4.8  --   --   HGB 9.4* 8.7* 8.1* 7.9*  HCT 29.6* 27.6* 25.3* 24.7*  MCV 78.7 80.2 79.3  --   PLT 514.0* 401* 428*  --    Cardiac Enzymes:  Recent Labs Lab 10/24/14 1013 10/24/14 1805 10/25/14 0522  TROPONINI <0.03 <0.03 <0.03   BNP: BNP (last 3 results) No results for input(s): BNP in the last 8760 hours.  ProBNP (last 3 results) No results for input(s): PROBNP in the last 8760 hours.  CBG:  Recent Labs Lab 10/25/14 1329 10/25/14 1618 10/25/14 2037 10/26/14 0715 10/26/14 1129  GLUCAP 197* 118* 203* 153* 191*       Signed:  Elonzo Sopp  [facility] 10/26/2014, 12:23 PM

## 2014-10-26 NOTE — Progress Notes (Addendum)
Reviewed discharge instructions with pt and family, answered all questions at this time.  Will continue to monitor until pt leaves floor.    Reviewed appointments already in place, and let pt/family know to call and set up others.  Unable to setup with offices on the weekend.

## 2014-10-28 ENCOUNTER — Telehealth: Payer: Self-pay | Admitting: Family Medicine

## 2014-10-28 ENCOUNTER — Encounter (HOSPITAL_COMMUNITY): Payer: Self-pay | Admitting: Internal Medicine

## 2014-10-28 NOTE — Telephone Encounter (Signed)
She should be OFF metformin, o/w her medication should be same as listed

## 2014-10-28 NOTE — Telephone Encounter (Signed)
Tammy from [facility] called wanting to know the medication that this patient is taking. Tammy states that a discharge summary was brought it but is was still confusing. Tammy wanted to know if you could provide her with the medication that this patient is taking. Pt was also prescribed Zebeta and her insurance does not cover this medication.

## 2014-10-28 NOTE — Telephone Encounter (Signed)
Informed Tammy per Dr. Jacinto Reap message

## 2014-10-28 NOTE — Telephone Encounter (Signed)
Tammy from [facility] calling to inquire on the inquire about current meds pt is supposed to be taking.  The hospital discharge is showing different meds than the med list that was just faxed from our office today.  The patient was discharged from the hospital on 7/8.  Please review chart and advise what meds the patient should be currently taking.  If Tammy is not available when you call back, please ask to speak with one of the other pharmacists.

## 2014-10-28 NOTE — Telephone Encounter (Signed)
Faxed Rx to pharmacy  

## 2014-10-29 ENCOUNTER — Encounter: Payer: Self-pay | Admitting: Internal Medicine

## 2014-10-30 ENCOUNTER — Encounter (HOSPITAL_COMMUNITY): Payer: Self-pay | Admitting: Lab

## 2014-10-30 ENCOUNTER — Encounter (HOSPITAL_COMMUNITY): Payer: Medicare Other | Attending: Internal Medicine | Admitting: Hematology & Oncology

## 2014-10-30 ENCOUNTER — Encounter (HOSPITAL_COMMUNITY): Payer: Self-pay | Admitting: Hematology & Oncology

## 2014-10-30 VITALS — BP 117/52 | HR 90 | Temp 98.7°F | Resp 18 | Wt 133.6 lb

## 2014-10-30 DIAGNOSIS — R531 Weakness: Secondary | ICD-10-CM

## 2014-10-30 DIAGNOSIS — C7800 Secondary malignant neoplasm of unspecified lung: Secondary | ICD-10-CM | POA: Diagnosis not present

## 2014-10-30 DIAGNOSIS — C649 Malignant neoplasm of unspecified kidney, except renal pelvis: Secondary | ICD-10-CM

## 2014-10-30 DIAGNOSIS — R630 Anorexia: Secondary | ICD-10-CM | POA: Diagnosis not present

## 2014-10-30 DIAGNOSIS — D649 Anemia, unspecified: Secondary | ICD-10-CM

## 2014-10-30 DIAGNOSIS — R05 Cough: Secondary | ICD-10-CM

## 2014-10-30 DIAGNOSIS — Z803 Family history of malignant neoplasm of breast: Secondary | ICD-10-CM

## 2014-10-30 DIAGNOSIS — R059 Cough, unspecified: Secondary | ICD-10-CM

## 2014-10-30 DIAGNOSIS — Z801 Family history of malignant neoplasm of trachea, bronchus and lung: Secondary | ICD-10-CM

## 2014-10-30 DIAGNOSIS — R634 Abnormal weight loss: Secondary | ICD-10-CM

## 2014-10-30 DIAGNOSIS — Z808 Family history of malignant neoplasm of other organs or systems: Secondary | ICD-10-CM

## 2014-10-30 NOTE — Patient Instructions (Addendum)
..  [place] [facility] Discharge Instructions  RECOMMENDATIONS MADE BY THE CONSULTANT AND ANY TEST RESULTS WILL BE SENT TO YOUR REFERRING PHYSICIAN. Disease process and plan of care discussed per Dr. Whitney Muse   We will make a referral to [facility] and they will call you to set up appointment to come out and see you. Please call us if we can help in any way, the hospice nurse will also communicate with [place] to help take care of you   Thank you for choosing [place] [facility] to provide your oncology and hematology care.  To afford each patient quality time with our provider, please arrive at least 15 minutes before your scheduled appointment time.    You need to re-schedule your appointment should you arrive 10 or more minutes late.  We strive to give you quality time with our providers, and arriving late affects you and other patients whose appointments are after yours.  Also, if you no show three or more times for appointments you may be dismissed from the clinic at the providers discretion.     Again, thank you for choosing [facility].  Our hope is that these requests will decrease the amount of time that you wait before being seen by our physicians.       _____________________________________________________________  Should you have questions after your visit to [facility], please contact our office at [phone] between the hours of 8:30 a.m. and 4:30 p.m.  Voicemails left after 4:30 p.m. will not be returned until the following business day.  For prescription refill requests, have your pharmacy contact our office.

## 2014-10-30 NOTE — Progress Notes (Signed)
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, Fuhrman grade 4 Prominent lymphovascular invasion Resection margins negative CT scan of the chest on 06/06/2014 with multiple sub-centimeter pulmonary nodules with dominant right lower lobe pulmonary nodule, large infiltrative right renal mass Chest 2 view on 08/29/2014 showing pulmonary nodules better demonstrated on prior CT and may indicate metastatic disease, cardiomegaly, mild central pulmonary vascular prominence CT scan of the chest on 10/24/2014 with significant progression of pulmonary metastatic disease most likely arising from a primary right renal cell carcinoma, interval extensive metastatic mediastinal and bilateral hilar adenopathy   HISTORY OF PRESENTING ILLNESS:  Kathleen Schroeder 81 y.o. female is here because of a newly diagnosed clear cell carcinoma of the kidney and pulmonary metastases. She has had a significant decline in her performance status and is here today for additional discussion and recommendations.The patient is here with her husband and son. She had a nephrectomy in April of 2016.  She has an appointment with the surgeon on the 18th of July.  One year ago, the patient says that she was very active and able to do anything that she wanted.  Now, she only does what she has to do.  She says that she gets dressed by herself everyday and cooks and cleans to a certain extent (although per her son, quite infrequently)  She says she thinks often about "how it used to be and how it is now" and that she would love to laugh.  Her son states that she currently does no type of activity.  She spends her days sleeping on the couch.  She notes she has difficulty getting out of bed each morning.   Around Christmas in 2015, her son states that her initial symptoms began  with a severe loss of appetite and a new cough.  The patient complains that she still has a "coughing spell" occasionally.  Her appetite has not improved however with her only eating enough to "get by".  She has experienced weight loss of about 38 lbs in the past year.  Her husband is very upset about her lack of appetite.  The patient denies SOB and fevers.  She is constipated however.  She has pain occasionally in her legs but has experienced no edema.    She feels that her overall condition has worsened since surgery. She has recently been admitted to [facility] on July 7th with weakness and dysphagia. Also complained of generalized weakness and was noted to be anemic, hyponatremic and had heme positive stools during that admission. She had a prior admission in May secondary to delirium and hypoglycemia from her sulfonylurea.  MEDICAL HISTORY:  Past Medical History  Diagnosis Date  . HYPERLIPIDEMIA 12/01/2009  . ESSENTIAL HYPERTENSION 12/16/2008  . ESOPHAGEAL STRICTURE 12/16/2008  . Heartburn 04/08/2008  . DYSPHAGIA 04/08/2008  . Cataract   . Depression   . DIABETES MELLITUS, CONTROLLED 09/01/2009  . GERD (gastroesophageal reflux disease)   . Glaucoma   . Barretts esophagus   . Hyperlipidemia   . Cancer     kidney -right  . Anemia   . Melena   . Hyponatremia   . Pulmonary nodule     SURGICAL HISTORY: Past Surgical History  Procedure Laterality Date  . Cholecystectomy    . Abdominal hysterectomy  1984  . Esophagogastroduodenoscopy N/A 10/02/2012  Procedure: ESOPHAGOGASTRODUODENOSCOPY (EGD);  Surgeon: Rogene Houston, MD;  [facility];  Service: Endoscopy;  Laterality: N/A;  . Foreign body removal N/A 10/02/2012    Procedure: FOREIGN BODY REMOVAL;  Surgeon: Rogene Houston, MD;  [facility]: [facility];  Service: Endoscopy;  Laterality: N/A;  . Laparoscopic nephrectomy Right 08/12/2014    Procedure: RIGHT LAPAROSCOPIC RADICAL NEPHRECTOMY;  Surgeon: Ardis Hughs,  MD;  Location: [facility];  Service: Urology;  Laterality: Right;  . Nephrectomy      right  . Esophagogastroduodenoscopy N/A 10/25/2014    Procedure: ESOPHAGOGASTRODUODENOSCOPY (EGD);  Surgeon: Daneil Dolin, MD;  [facility];  Service: Endoscopy;  Laterality: N/A;  . Esophageal dilation N/A 10/25/2014    Procedure: ESOPHAGEAL DILATION;  Surgeon: Daneil Dolin, MD;  [facility];  Service: Endoscopy;  Laterality: N/A;    SOCIAL HISTORY: History   Social History  . Marital Status: Married    Spouse Name: N/A  . Number of Children: N/A  . Years of Education: N/A   Occupational History  . Not on file.   Social History Main Topics  . Smoking status: Never Smoker   . Smokeless tobacco: Never Used  . Alcohol Use: No  . Drug Use: No  . Sexual Activity: Not on file   Other Topics Concern  . 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 Stroke Mother   . Cancer Sister     brain, breast   has no family status information on file.  Mother deceased, 11, stroke Father deaceased, 24, heart attack 2 sisters deceased from lung cancer and brain tumor 2 brothers deceased from kidney failure and old age  ALLERGIES:  has No Known Allergies.  MEDICATIONS:  Current Outpatient Prescriptions  Medication Sig Dispense Refill  . amLODipine (NORVASC) 5 MG tablet Take 1 tablet by mouth daily.  10  . brimonidine (ALPHAGAN) 0.15 % ophthalmic solution Place 1 drop into both eyes 2 (two) times daily.     . dorzolamide-timolol (COSOPT) 22.3-6.8 MG/ML ophthalmic solution Place 1 drop into both eyes 2 (two) times daily.     Marland Kitchen losartan (COZAAR) 50 MG tablet Take 1 tablet (50 mg total) by mouth daily. 30 tablet 11  . megestrol (MEGACE) 40 MG tablet Take 1 tablet  (40 mg total) by mouth 2 (two) times daily. 60 tablet 0  . simvastatin (ZOCOR) 20 MG tablet Take 1 tablet (20 mg total) by mouth daily. 30 tablet 11  . ACCU-CHEK AVIVA PLUS test strip Check 1 time daily. E11.9 100 each 2  . ACCU-CHEK SOFTCLIX LANCETS lancets USE AS DIRECTED 100 each 2  . albuterol (PROVENTIL HFA;VENTOLIN HFA) 108 (90 BASE) MCG/ACT inhaler Inhale 1-2 puffs into the lungs every 6 (six) hours as needed for wheezing or shortness of breath. (Patient not taking: Reported on 10/30/2014) 1 Inhaler 0  . bisoprolol-hydrochlorothiazide (ZIAC) 5-6.25 MG per tablet Take 1 tablet by mouth daily.    Marland Kitchen esomeprazole (NEXIUM) 40 MG capsule Take 1 capsule by mouth daily.   1  . mirtazapine (REMERON) 15 MG tablet Take 1 tablet by mouth at bedtime.   0   No current facility-administered medications for this visit.    Review of Systems  Constitutional: Positive for  weight loss and malaise/fatigue. Negative for fever, chills and diaphoresis.  HENT: Negative for congestion, ear discharge, ear pain, hearing loss, nosebleeds, sore throat and tinnitus.   Eyes: Negative.   Respiratory: Positive for cough and shortness of breath. Negative for hemoptysis, sputum production, wheezing and stridor.   Gastrointestinal: Positive for constipation. Negative for heartburn, nausea, vomiting, abdominal pain, diarrhea, blood in stool and melena.       Decreased appetite  Genitourinary: Negative.   Musculoskeletal: Positive for joint pain. Negative for myalgias, back pain, falls and neck pain.  Skin: Negative.   Neurological: Negative.  Negative for headaches.  Endo/Heme/Allergies: Negative.   Psychiatric/Behavioral: Positive for depression. Negative for suicidal ideas, hallucinations and substance abuse. The patient is not nervous/anxious.   All other systems reviewed and are negative.  14 point ROS was done and is otherwise as detailed above or in HPI   PHYSICAL EXAMINATION: ECOG PERFORMANCE STATUS: 3 -  Symptomatic, >50% confined to bed  Filed Vitals:   10/30/14 1514  BP: 117/52  Pulse: 90  Temp: 98.7 F (37.1 C)  Resp: 18   Filed Weights   10/30/14 1514  Weight: 133 lb 9.6 oz (60.601 kg)     Physical Exam  Constitutional: She is oriented to person, place, and time.  Needs assistance onto and off of the exam table. Came into the clinic in a wheelchair  HENT:  Head: Normocephalic and atraumatic.  Nose: Nose normal.  Mouth/Throat: Oropharynx is clear and moist. No oropharyngeal exudate.  Eyes: Conjunctivae and EOM are normal. Pupils are equal, round, and reactive to light. Right eye exhibits no discharge. Left eye exhibits no discharge. No scleral icterus.  Neck: Normal range of motion. Neck supple. No tracheal deviation present. No thyromegaly present.  Cardiovascular: Normal rate, regular rhythm and normal heart sounds.  Exam reveals no gallop and no friction rub.   No murmur heard. Pulmonary/Chest: Effort normal and breath sounds normal. She has no wheezes. She has no rales.  cough  Abdominal: Soft. Bowel sounds are normal. She exhibits no distension and no mass. There is no tenderness. There is no rebound and no guarding.  Well healed surgical incision sites  Musculoskeletal: Normal range of motion. She exhibits no edema.  Lymphadenopathy:    She has no cervical adenopathy.  Neurological: She is alert and oriented to person, place, and time. She has normal reflexes. No cranial nerve deficit. Gait normal. Coordination normal.  Skin: Skin is warm and dry. No rash noted.  Psychiatric: Memory, affect and judgment normal.  Mood somewhat depressed  Nursing note and vitals reviewed.  PATHOLOGY: REPORT OF SURGICAL DIAGNOSIS Diagnosis Kidney, radical nephrectomy for tumor, right - CLEAR CELL RENAL CELL CARCINOMA WITH RHABDOID FEATURES, FUHRMAN GRADE 4. - TWO FOCI, MEASURING 9 CM AND 0.4 CM. - LARGER TUMOR INVADES PERINEPHRIC FAT. - PROMINENT LYMPHOVASCULAR INVASION  PRESENT. - RESECTION MARGINS ARE NEGATIVE. - SEE ONCOLOGY TABLE. Microscopic Comment KIDNEY Specimen, including laterality: Right kidney. Procedure: Right radical nephrectomy. Tumor focality: Multifocal. Maximum tumor size (cm): 9 cm and 0.4 cm. Macroscopic extent of tumor: Larger tumor invades perinephric fat. 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 If sarcomatoid features give percentage: N/A. Fuhrman Nuclear Grade: 4 (larger tumor), 3 (smaller tumor). Margins (renal vein, ureter, soft tissue): Negative. Renal vein invasion: Absent. Microscopic tumor extension: Into perinephric fat. Adrenal gland: N/A. Lymph nodes: number examined 0 ; number positive 0 TNM code: mpT3a, pNX Non-neoplastic kidney: Scattered chronic inflammation. Comments: Dr. Gari Crown has reviewed  the case. Vicente Males MD Pathologist, Electronic Signature  LABORATORY DATA:  I have reviewed the data as listed Lab Results  Component Value Date   WBC 6.7 10/25/2014   HGB 7.9* 10/26/2014   HCT 24.7* 10/26/2014   MCV 79.3 10/25/2014   PLT 428* 10/25/2014     RADIOGRAPHIC STUDIES: I have personally reviewed the radiological images as listed and agreed with the findings in the report.  CLINICAL DATA: Followup lung nodules which had increased in number and size on a chest radiograph earlier today.  EXAM: CT CHEST WITHOUT CONTRAST  TECHNIQUE: Multidetector CT imaging of the chest was performed following the standard protocol without IV contrast.  COMPARISON: CT dated 06/06/2014 and chest radiograph obtained earlier today.  FINDINGS: Interval increase in size of multiple mediastinal and hilar lymph nodes. These include an AP window lymph node with a short axis diameter of 18 mm on image number 19, right anterior para-aortic lymph node with a short axis diameter of 20 mm on image number 16, precarinal node with a short axis diameter of 15 mm on image number 23,  subcarinal node with a short axis diameter of 21 mm on image number 28, right hilar node with a short axis diameter of 13 mm on image number 28 and left hilar node with a short axis diameter of 16 mm on image number 23.  There has been a significant increase in size and number of multiple lung nodules. Previously, the largest nodule was in the right lower lobe, measuring 7 mm in maximum diameter. Currently, that nodule measures 15 mm in maximum diameter.  The previously demonstrated liver cysts have not changed significantly. No interval solid liver masses are visualized. The previously demonstrated right renal mass is not included on today's images. There is interval eccentric wall thickening in a small sliding hiatal hernia, measuring 10 mm in maximum thickness on image number 45. No upper abdominal adenopathy currently visualized. The included portions of the adrenal glands have normal appearances.  Dense atheromatous coronary artery calcifications are noted. Also noted are thoracic spine degenerative changes. No evidence of bony metastatic disease.  IMPRESSION: 1. Significant progression of pulmonary metastatic disease, most likely arising from a primary right renal cell carcinoma. 2. Interval extensive metastatic mediastinal and bilateral hilar adenopathy. 3. Dense atheromatous coronary artery calcifications.  Electronically Signed: By: Claudie Revering M.D. On: 10/24/2014 14:56   ASSESSMENT & PLAN:  Stage IV RCC PS of 3 Weakness Anorexia Weight loss greater than 10% body weight Anemia  Although not biopsy proven the pulmonary nodules are most likely renal cell carcinoma. They were present at the time of surgery although very small in nature. Discussed with her son that at that point additional imaging would not of been helpful nor would a  biopsy have been an option. It was suspicious at diagnosis that she had stage IV disease.  The patient has had a significant  decline that appears to be quite progressive over the last several weeks. She is very adamant about her goals of care which are to be at home and be comfortable. She feels that coming to the hospital and to doctor's offices is very difficult and inconvenient. Her family is very clear that their goal would be to have her with them as long as possible however they will honor her wishes.  We discussed potentially biopsying a lung lesion, she is not interested in pursuing this. She is not interested in obtaining laboratory studies. Her performance status is quite poor and  I feel she would tolerate systemic therapy poorly however we did discuss treatments for advanced renal cell carcinoma and she is not interested in pursuing any of these.  We discussed end-of-life issues and she notes that they have had few discussions about this. She states she has certainly thought about these things. Comfort is most important to her and spending time with her family. They all understand that she is terminal.  At the end of our discussion today they wish for a hospice consultation. We [facility]. I had a discussion with her husband in regarding her anorexia and had to reassure him that this is a natural process. I advised him that are maybe things hospice can do to help improve her appetite some.  The [facility] was [facility], the family would like for them to contact their son [name]. Hospice was provided with his number.  All questions were answered. The patient knows to call the clinic with any problems, questions or concerns.   This document serves as a record of services personally performed by Ancil Linsey, MD. It was created on her behalf by Janace Hoard, a trained medical scribe. The creation of this record is based on the scribe's personal observations and the provider's statements to them. This document has been checked and approved by  the attending provider.  I have reviewed the above documentation for accuracy and completeness, and I agree with the above.  This note was electronically signed.    Kelby Fam. Whitney Muse, MD

## 2014-10-30 NOTE — Progress Notes (Signed)
Referral sent to Hospice on 7/13.  Records faxed on 7/13

## 2014-11-04 ENCOUNTER — Ambulatory Visit (INDEPENDENT_AMBULATORY_CARE_PROVIDER_SITE_OTHER): Payer: Medicare Other | Admitting: Family Medicine

## 2014-11-04 VITALS — BP 118/70 | HR 81 | Temp 98.4°F | Wt 132.0 lb

## 2014-11-04 DIAGNOSIS — C649 Malignant neoplasm of unspecified kidney, except renal pelvis: Secondary | ICD-10-CM

## 2014-11-04 DIAGNOSIS — E43 Unspecified severe protein-calorie malnutrition: Secondary | ICD-10-CM

## 2014-11-04 DIAGNOSIS — E119 Type 2 diabetes mellitus without complications: Secondary | ICD-10-CM

## 2014-11-04 LAB — POCT GLUCOSE (DEVICE FOR HOME USE): POC Glucose: 166 mg/dl — AB (ref 70–99)

## 2014-11-04 NOTE — Progress Notes (Signed)
Pre visit review using our clinic review tool, if applicable. No additional management support is needed unless otherwise documented below in the visit note. 

## 2014-11-04 NOTE — Progress Notes (Signed)
Subjective:    Patient ID: Kathleen Schroeder, female    DOB: Aug 25, 1930, 81 y.o.   MRN: [id number]  HPI Patient has metastatic renal cancer and recent issues with progressive weight loss and dysphagia. She was admitted on 10/24/2014. She underwent upper endoscopy and apparently did have esophageal dilatation. Her biopsies revealed mild chronic gastritis. Negative for Helicobacter pylori. No evidence for malignancy. She does feel her swallowing is somewhat improved at this time but continues to have very poor appetite. Her metformin was discontinued, though her renal function has been stable.  No alternative medications were given for metformin and her blood sugars been somewhat up and down since discharge. Blood sugar couple days ago 246. 2 hour postprandial blood sugar today 166. She is drinking fluids but very poor appetite. She has seen oncology and has decided against any form of chemotherapy.  We had a long discussion with family regarding advanced directives. They currently do not have any advanced directives husband thinks though that they have someone coming out tomorrow from Paliative care to discuss things.  Past Medical History  Diagnosis Date  . HYPERLIPIDEMIA 12/01/2009  . ESSENTIAL HYPERTENSION 12/16/2008  . ESOPHAGEAL STRICTURE 12/16/2008  . Heartburn 04/08/2008  . DYSPHAGIA 04/08/2008  . Cataract   . Depression   . DIABETES MELLITUS, CONTROLLED 09/01/2009  . GERD (gastroesophageal reflux disease)   . Glaucoma   . Barretts esophagus   . Hyperlipidemia   . Cancer     kidney -right  . Anemia   . Melena   . Hyponatremia   . Pulmonary nodule    Past Surgical History  Procedure Laterality Date  . Cholecystectomy    . Abdominal hysterectomy  1984  . Esophagogastroduodenoscopy N/A 10/02/2012    Procedure: ESOPHAGOGASTRODUODENOSCOPY (EGD);  Surgeon: Rogene Houston, MD;  [facility];  Service: Endoscopy;  Laterality: N/A;  . Foreign body removal N/A 10/02/2012   Procedure: FOREIGN BODY REMOVAL;  Surgeon: Rogene Houston, MD;  [facility];  Service: Endoscopy;  Laterality: N/A;  . Laparoscopic nephrectomy Right 08/12/2014    Procedure: RIGHT LAPAROSCOPIC RADICAL NEPHRECTOMY;  Surgeon: Ardis Hughs, MD;  Location: [facility];  Service: Urology;  Laterality: Right;  . Nephrectomy      right  . Esophagogastroduodenoscopy N/A 10/25/2014    Procedure: ESOPHAGOGASTRODUODENOSCOPY (EGD);  Surgeon: Daneil Dolin, MD;  [facility];  Service: Endoscopy;  Laterality: N/A;  . Esophageal dilation N/A 10/25/2014    Procedure: ESOPHAGEAL DILATION;  Surgeon: Daneil Dolin, MD;  [facility];  Service: Endoscopy;  Laterality: N/A;    reports that she has never smoked. She has never used smokeless tobacco. She reports that she does not drink alcohol or use illicit drugs. family history includes Cancer in her sister; Stroke in her mother. No Known Allergies    Review of Systems  Constitutional: Positive for appetite change and fatigue. Negative for fever and chills.  Respiratory: Negative for cough and shortness of breath.   Cardiovascular: Negative for chest pain.  Gastrointestinal: Positive for nausea. Negative for vomiting, abdominal pain and diarrhea.  Genitourinary: Negative for dysuria.  Neurological: Negative for dizziness.       Objective:   Physical Exam  Constitutional:  Alert but somewhat pale appearing 81 year old female in no distress  Neck: Neck supple. No thyromegaly present.  Cardiovascular: Normal rate and regular rhythm.   Pulmonary/Chest: Effort normal and breath sounds normal. No respiratory distress. She has no wheezes. She has  no rales.  Abdominal: Soft. There is no tenderness.  Musculoskeletal: She exhibits no edema.  Neurological: She is alert.          Assessment & Plan:  #1 metastatic renal cancer. Patient has decided against any form of chemotherapy. We discussed advanced directives. They're  somewhat undecided but she is considering DO NOT RESUSCITATE. We also discussed palliative care and hospice care and have someone coming out to assess her tomorrow  #2 type 2 diabetes. Recent discontinuation of metformin. Would not use sulfonylurea with her very poor appetite and risk of hypoglycemia. We discussed possible use of long-acting insulin such as Levemir or Lantus but at this point they will monitor first. We're not aiming for tight control with her metastatic cancer #3 poor appetite.  We discussed liquid Megace as she is not wanting to take any more "pills" and she is reluctant to make any changes.

## 2014-11-12 ENCOUNTER — Telehealth: Payer: Self-pay | Admitting: Family Medicine

## 2014-11-12 NOTE — Telephone Encounter (Signed)
Melissa from Hospice is calling requesting that she be able to order the Lorazepam 1mg  PO Q3 hours PRN for Moderate anxiety from the standing orders.

## 2014-11-12 NOTE — Telephone Encounter (Signed)
OK 

## 2014-11-13 NOTE — Telephone Encounter (Signed)
Per Montrice order was given verbally to [facility].

## 2014-12-19 DEATH — deceased

## 2016-12-20 IMAGING — MR MR ABDOMEN WO/W CM
7 of 16 series · 21 of 48 positions shown · IV contrast (multihance)
Comparison: CT abdomen dated 06/06/2014

CLINICAL DATA: Evaluate right renal mass on CT

EXAM:
MRI ABDOMEN WITHOUT AND WITH CONTRAST
TECHNIQUE: Multiplanar multisequence MR imaging of the abdomen was performed
both before and after the administration of intravenous contrast.
CONTRAST:  14mL MULTIHANCE GADOBENATE DIMEGLUMINE 529 MG/ML IV SOLN

[Series 3: DWI b500 · axial · 6.0mm · 1.48mm/px · z∈[-111,+193]mm · 4 of 80 slices shown]
[im 1/80]
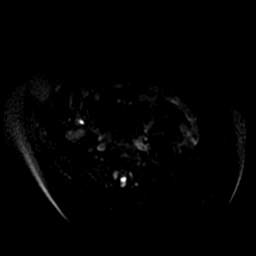
[im 27/80]
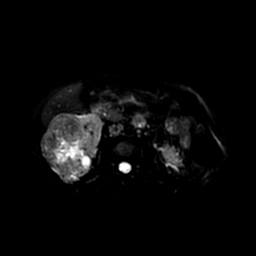
[im 53/80]
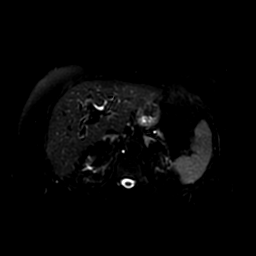
[im 80/80]
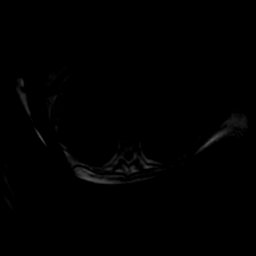

[Series 4: T2 fat-sat · axial · 5.0mm · 0.78mm/px · z∈[-87,+182]mm · 3 of 55 slices shown]
[im 1/55]
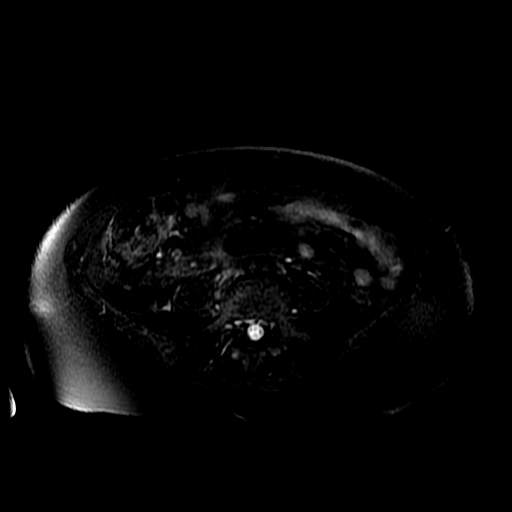
[im 28/55]
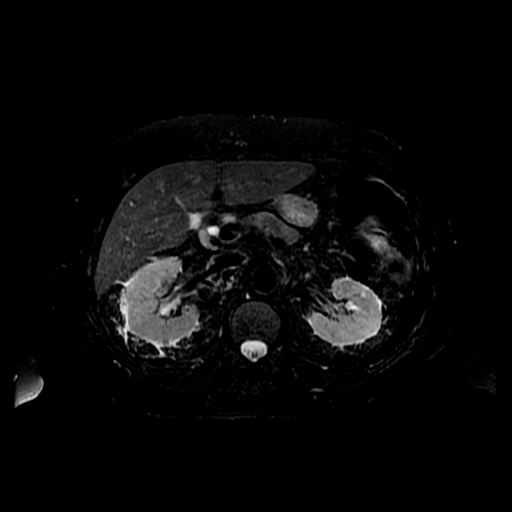
[im 55/55]
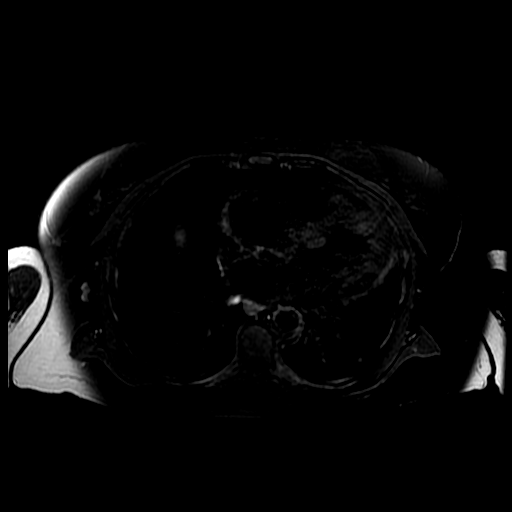

[Series 5: ax dualecho · axial · 5.0mm · 0.78mm/px · z∈[-80,+189]mm · 6 of 110 slices shown]
[im 1/110]
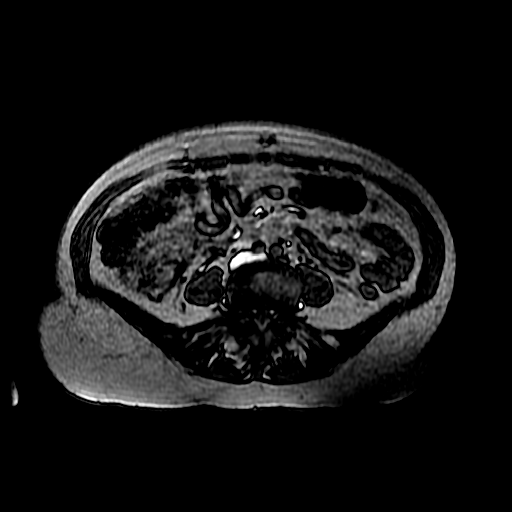
[im 22/110]
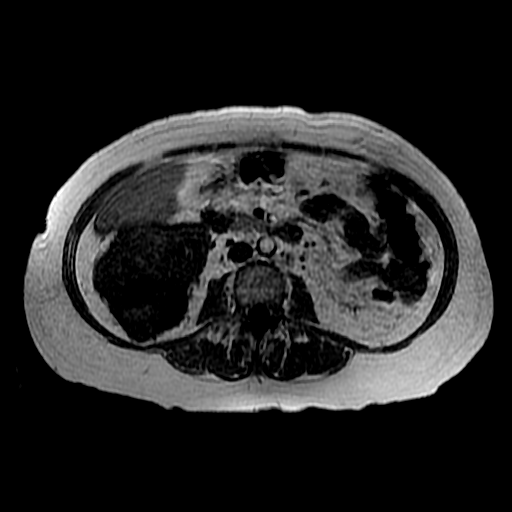
[im 44/110]
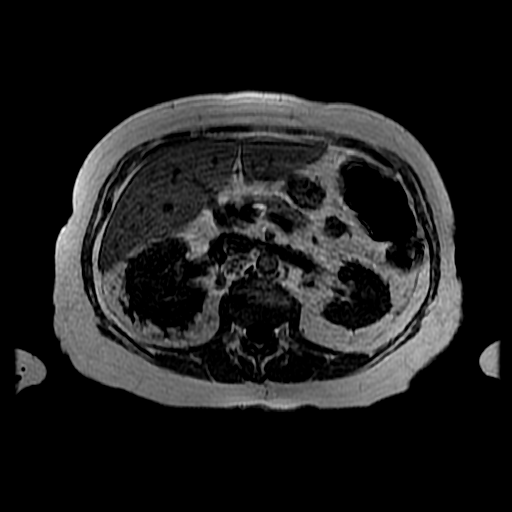
[im 66/110]
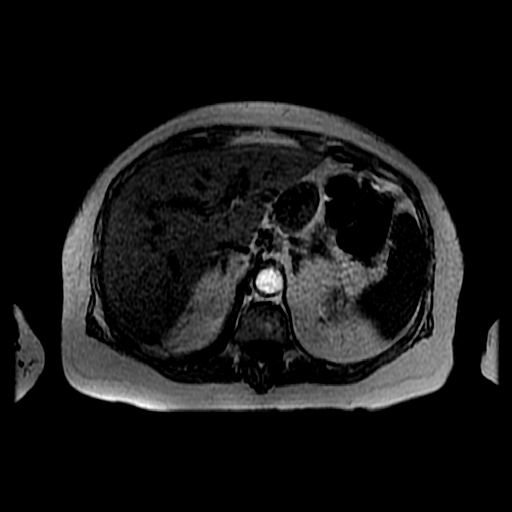
[im 88/110]
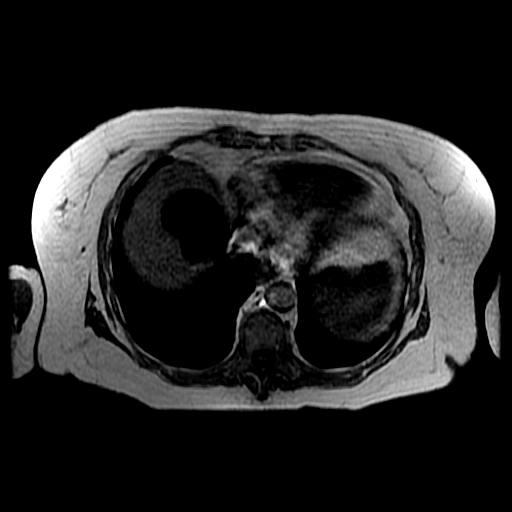
[im 110/110]
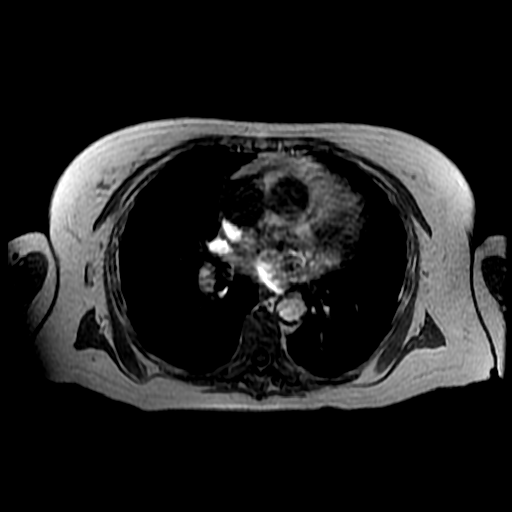

[Series 6: T2 · axial · 5.0mm · 0.78mm/px · z∈[-80,+189]mm · 2 of 55 slices shown (1 of 2)]
[im 1/55]
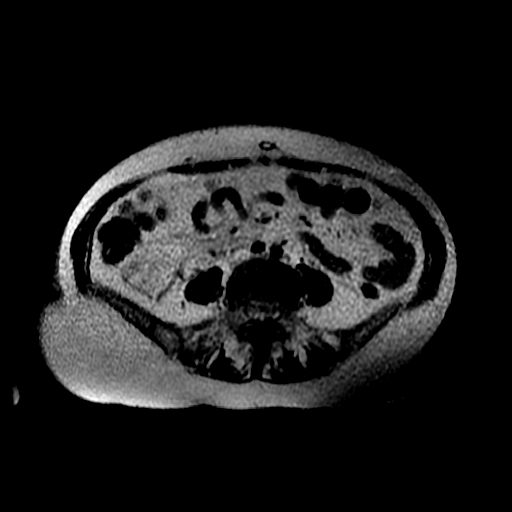
[im 55/55]
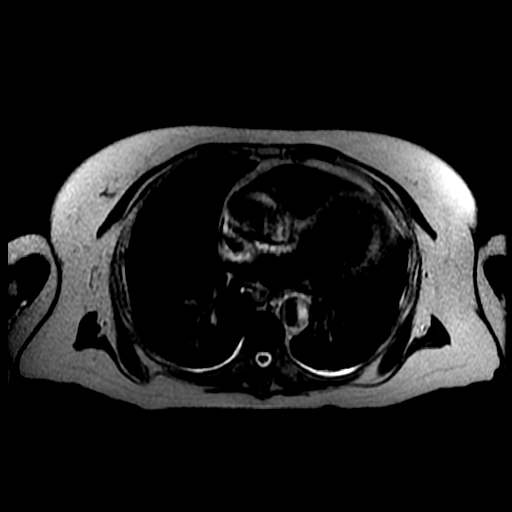

[Series 7: T2 · coronal · 5.0mm · 0.78mm/px · 2 of 43 slices shown (2 of 2)]
[im 1/43]
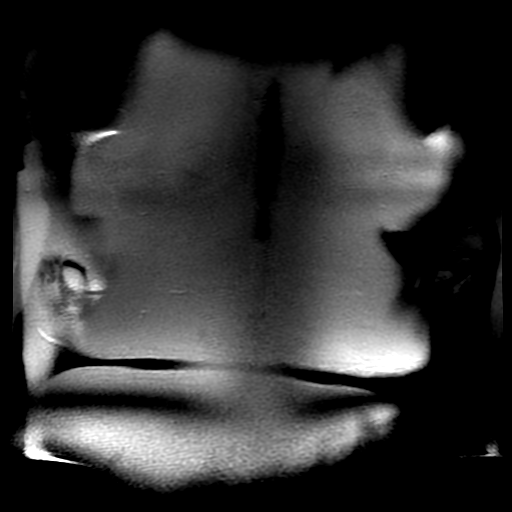
[im 43/43]
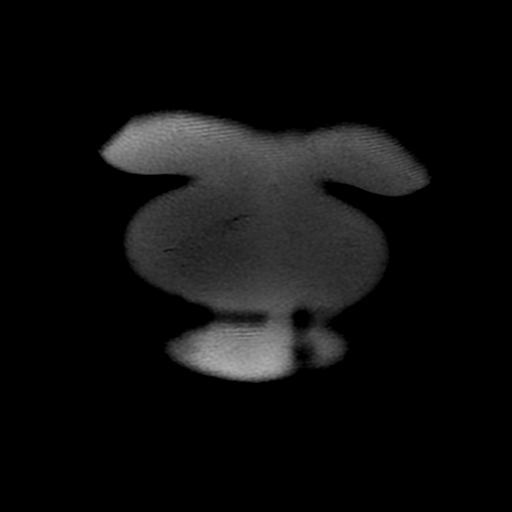

[Series 8: bSSFP · axial · 5.0mm · 0.78mm/px · z∈[-80,+189]mm · 2 of 55 slices shown]
[im 1/55]
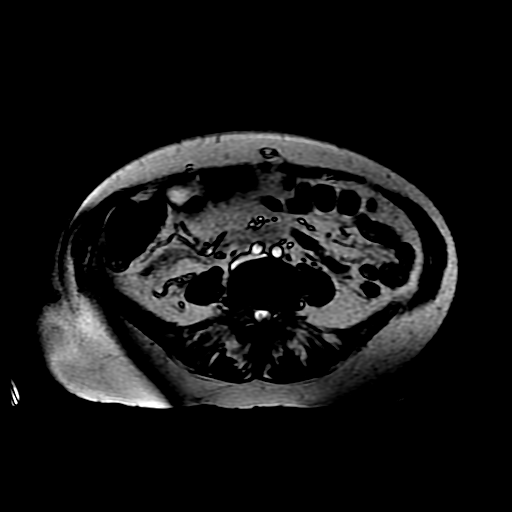
[im 55/55]
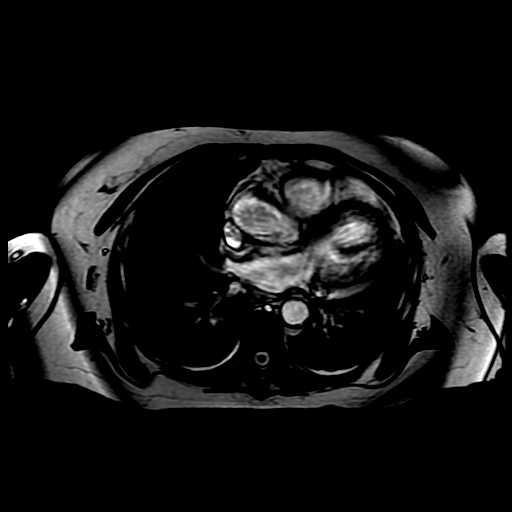

[Series 12: T1 dynamic · axial · 7.0mm · 0.78mm/px · z∈[-81,+55]mm · 2 of 80 slices shown]
[im 1/80]
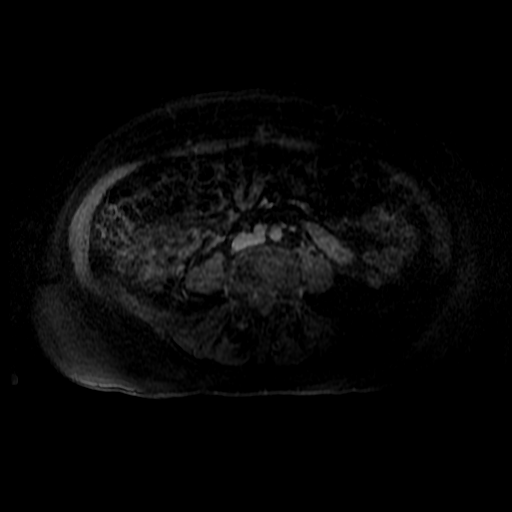
[im 40/80]
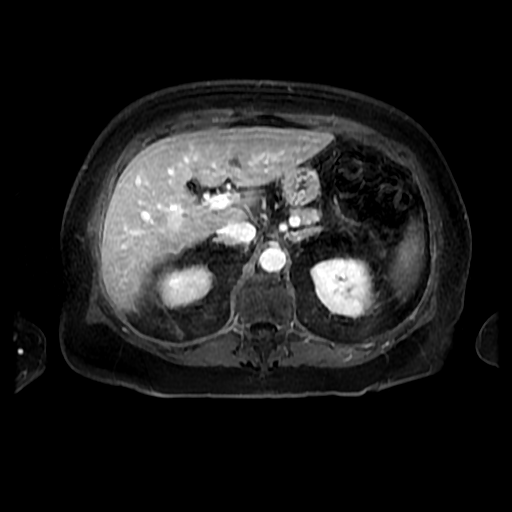

[21 of 48 positions shown; findings below may reference images not displayed]

FINDINGS: Motion degraded images.

Lower chest: 7 mm nodule in the posterior right lower lobe (series
900/image 16). Additional bilateral pulmonary nodules are better
visualized on CT.

Hepatobiliary: 5.5 cm cyst in the central right hepatic dome (series
4/ image 7). 2.5 cm cyst in the lateral segment left hepatic dome
(series 4/ image 13).

Status post cholecystectomy. No intrahepatic or extrahepatic ductal
dilatation.

Pancreas: Within normal limits.

Spleen: Within normal limits.

Adrenals/Urinary Tract: Adrenal glands are unremarkable.

Left kidney is within normal limits.  No hydronephrosis.

9.7 x 9.0 x 10.3 cm heterogeneously enhancing enhancing, centrally
necrotic posterior right lower pole mass, compatible with renal cell
carcinoma.

Additional renal cysts, including a 13 mm hemorrhagic cyst along the
lateral right upper kidney. No hydronephrosis.

Stomach/Bowel: Stomach and visualized bowel are unremarkable.

Vascular/Lymphatic: Single right renal artery and vein. Associated
neovascularity inferiorly. Renal vein invasion is difficult to
exclude on this motion degraded study.

No regional lymphadenopathy.

Other: No abdominal ascites.

Musculoskeletal: No focal osseous lesions.
IMPRESSION: 10.3 cm enhancing right lower pole mass, compatible with renal cell
carcinoma.

Renal vein invasion is difficult to exclude on this motion degraded
evaluation.

No regional lymphadenopathy.

7 mm right lower lobe nodule, suspicious for metastasis. Additional
bilateral pulmonary nodules are better evaluated on CT.
